# Patient Record
Sex: Male | Born: 1945 | Race: White | Hispanic: No | Marital: Married | Smoking: Former smoker
Health system: Southern US, Community
[De-identification: ages and names within clinical notes are randomized; demographics above are authoritative.]

## PROBLEM LIST (undated history)

## (undated) DIAGNOSIS — F418 Other specified anxiety disorders: Secondary | ICD-10-CM

## (undated) DIAGNOSIS — C099 Malignant neoplasm of tonsil, unspecified: Secondary | ICD-10-CM

## (undated) HISTORY — PX: LIPOMA EXCISION: SHX5283

## (undated) HISTORY — DX: Other specified anxiety disorders: F41.8

---

## 2005-09-14 ENCOUNTER — Emergency Department (HOSPITAL_COMMUNITY): Admission: EM | Admit: 2005-09-14 | Discharge: 2005-09-14 | Payer: Self-pay | Admitting: Emergency Medicine

## 2015-02-17 DIAGNOSIS — C099 Malignant neoplasm of tonsil, unspecified: Secondary | ICD-10-CM

## 2015-02-17 HISTORY — DX: Malignant neoplasm of tonsil, unspecified: C09.9

## 2015-02-17 HISTORY — PX: OTHER SURGICAL HISTORY: SHX169

## 2015-03-17 NOTE — Progress Notes (Signed)
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 [x]  []  15lb weight loss last 3 months  Swallowing concerns? []  [x]    PEG? []  [x]     Referrals Yes No Comments  Social Work? []  [x]    Dentistry? []  []  Edentulous maxilla, 6 anterior mandibular teeth  Swallowing therapy? []  [x]    Nutrition? []  [x]    Med/Onc? []  [x]     Safety Issues Yes No Comments  Prior radiation? []  [x]    Pacemaker/ICD? []  [x]    Possible current pregnancy? []  [x]    Is the patient on methotrexate? []  [x]     Tobacco/Marijuana/Snuff/ETOH use: was a 1 pack a day smoker but has stopped smoking for the last 12 years  Past/Anticipated interventions by otolaryngology, if any:   Past/Anticipated interventions by medical oncology, if any:  Current Complaints / other details:

## 2015-03-19 ENCOUNTER — Telehealth: Payer: Self-pay | Admitting: Hematology and Oncology

## 2015-03-19 ENCOUNTER — Ambulatory Visit
Admission: RE | Admit: 2015-03-19 | Discharge: 2015-03-19 | Disposition: A | Discharge status: Home / Self Care | Payer: Medicare Other | Source: Ambulatory Visit | Attending: Radiation Oncology | Admitting: Radiation Oncology

## 2015-03-19 ENCOUNTER — Encounter: Payer: Self-pay | Admitting: Radiation Oncology

## 2015-03-19 ENCOUNTER — Telehealth: Payer: Self-pay | Admitting: *Deleted

## 2015-03-19 VITALS — BP 127/76 | HR 68 | Temp 98.4°F | Resp 12 | Wt 141.2 lb

## 2015-03-19 DIAGNOSIS — Z87891 Personal history of nicotine dependence: Secondary | ICD-10-CM | POA: Diagnosis not present

## 2015-03-19 DIAGNOSIS — Z510 Encounter for antineoplastic radiation therapy: Secondary | ICD-10-CM | POA: Insufficient documentation

## 2015-03-19 DIAGNOSIS — C099 Malignant neoplasm of tonsil, unspecified: Secondary | ICD-10-CM | POA: Insufficient documentation

## 2015-03-19 DIAGNOSIS — R634 Abnormal weight loss: Secondary | ICD-10-CM | POA: Insufficient documentation

## 2015-03-19 DIAGNOSIS — R590 Localized enlarged lymph nodes: Secondary | ICD-10-CM | POA: Insufficient documentation

## 2015-03-19 NOTE — Telephone Encounter (Signed)
  Oncology Nurse Navigator Documentation Referral date to RadOnc/MedOnc: 03/11/15 (03/19/15 1638) Navigator Encounter Type: Introductory phone call (03/19/15 1638)      Placed introductory call to new referral patient.  LVM introduced myself as the oncology nurse navigator that works with Dr. Isidore Moos with whom he met earlier today.  Indicated I would be joining him tomorrow during his appt with Dr. Alvy Bimler.  Gayleen Orem, RN, BSN, [place] at [place] [phone]

## 2015-03-19 NOTE — Progress Notes (Signed)
Radiation Oncology         [phone] ________________________________  Initial Outpatient Consultation  Name: Paxon Propes MRN: [id number]  Date: 03/19/2015  DOB: 1945/12/15  CC:No primary care provider on file.  Melida Quitter, MD   REFERRING PHYSICIAN: Melida Quitter, MD  DIAGNOSIS:    ICD-9-CM ICD-10-CM   1. Malignant neoplasm of tonsil 146.0 C09.9 Ambulatory referral to Social Work  2. 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 male who presented with a sore throat and ulcer in the region of his right tonsil and soft palate months ago. He initially irrigated this at home but did not seek medical attention. The patient eventually saw Dr. Redmond Baseman who also palpated a 1.5cm retro-mandibular right lymph node. A right tonsil mass was fairly infiltratrative, judged to be T3 stage. Biopsy on 02-17-15 of the oropharynx relevealed pathology as described above in the diagnosis. The patient has not yet had any diagnostic imaging. "In March I thought it was like strep. I just ignored it for about a week, at that point I thought I just had an ulcer. This went on for about a month and a half. When I irrigated, water would come out of the roof of my mouth. Every now and then I get twinges in my (right) ear," the patient stated. The patient's lymph node (upper right neck) is hard and jaw is stiff. The patient was accompanied by his male partner for today's radiation oncology appointment. They are dedicated  to an "organic diet", which may have contributed to his recent weight loss of 10-15 pounds. 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 The patient also stated that he needs to visit [place] for work from September to October of 2016, but is flexible due to his treatment plan.      Nutrition Status Yes No Comments  Weight changes? [x]  []  15lb weight loss last 3 months  Swallowing concerns? []  [x]    PEG? []  [x]     Referrals Yes No Comments  Social Work? []  [x]    Dentistry? []  []  Edentulous maxilla, 6 anterior mandibular teeth  Swallowing therapy? []  [x]    Nutrition? []  [x]    Med/Onc? []  [x]     Safety Issues Yes No Comments  Prior radiation? []  [x]    Pacemaker/ICD? []  [x]    Possible current pregnancy? []  [x]    Is the patient on methotrexate? []  [x]     Tobacco/Marijuana/Snuff/ETOH use: was a 1 pack a day smoker but has stopped smoking for the last 12 years. It was stated during the 03/19/2015 radiation oncology appointment that the patient smoked until 2009 and that he would smoke organic cigarettes socially until quitting 3-4 years ago. He denies heavy drinking or using chewing tobacco. He confirms smoking mariajuana socially the past 3-4 years.  PREVIOUS  RADIATION THERAPY: No  PAST MEDICAL HISTORY:  has no past medical history on file.    PAST SURGICAL HISTORY:History reviewed. No pertinent past surgical history.  FAMILY HISTORY: family history includes Other in his brother.  SOCIAL HISTORY:  reports that he quit smoking about 12 years ago. He has never used smokeless tobacco. He reports that he uses illicit drugs (Marijuana). He reports that he does not drink alcohol.  ALLERGIES: Review of patient's allergies indicates not on file.  MEDICATIONS:  No current outpatient prescriptions on file.   No current facility-administered medications for this encounter.     REVIEW OF SYSTEMS:  Notable for that above.   PHYSICAL EXAM:  weight is 141 lb 3.2 oz (64.048 kg). His oral temperature is 98.4 F (36.9 C). His blood pressure is 127/76 and his pulse is 68. His respiration is 12 and oxygen saturation is 100%.   General: Alert and oriented, in no acute distress HEENT: Head is normocephalic. Extraocular movements are intact. Oropharynx is notable for tongue deviating slightly to the right. 3cm mass observed via the R tonsil area, infiltrative into soft palate and posterior base of tongue, with erosive component. I suspect the tumor infiltrates > 4cm of tissue (T3). Upper Dentures were removed.  Trismus. Neck: Neck is notable for 1.5cm palpable lymph node that is firm at the angle of the right mandible. Heart: Regular in rate and rhythm with no murmurs, rubs, or gallops. Chest: Clear to auscultation bilaterally, with no rhonchi, wheezes, or rales. Abdomen: Soft, nontender, nondistended, with no rigidity or guarding. Over the abdomen there are palpable masses consistent with lipomas. The patient reported that this runs in the family and has been present most of his life. Extremities: No cyanosis or edema. Over the forearms there are palpable masses consistent with lipomas. The patient reported that this runs in the family and has been present most of his life. Lymphatics: see Neck Exam Skin: No concerning lesions. Musculoskeletal: symmetric strength and muscle tone throughout.  Neurologic: Cranial nerves II through XII are grossly intact. No obvious focalities. Speech is fluent. Coordination is intact. Psychiatric: Judgment and insight are intact. Affect is appropriate.   ECOG = 1  0 - Asymptomatic (Fully active, able to carry on all predisease activities without restriction)  1 - Symptomatic but completely ambulatory (Restricted in physically strenuous activity but ambulatory and able to carry out work of a light or sedentary nature. For example, light  housework, office work)  2 - Symptomatic, <50% in bed during the day (Ambulatory and capable of all self care but unable to carry out any work activities. Up and about more than 50% of waking hours)  3 - Symptomatic, >50% in bed, but not bedbound (Capable of only limited self-care, confined to bed or chair 50% or more of waking hours)  4 - Bedbound (Completely disabled. Cannot carry on any self-care. Totally confined to bed or chair)  5 - Death   Eustace Pen MM, Creech RH, Tormey DC, et al. [phone]). "Toxicity and response criteria of the [facility] Group". [place] Oncol. 5 (6): 649-55   LABORATORY DATA:  No results found for: WBC, HGB, HCT, MCV, PLT CMP  No results found for: NA, K, CL, CO2, GLUCOSE, BUN, CREATININE, CALCIUM, PROT, ALBUMIN, AST, ALT, ALKPHOS, BILITOT, GFRNONAA, GFRAA       RADIOGRAPHY: No results found.    IMPRESSION/PLAN:  This is a delightful patient with head and neck cancer. I do recommend radiotherapy for this patient.  We discussed the potential risks, benefits, and side effects of radiotherapy. We talked in detail about acute and late effects. We discussed that some of the most bothersome acute effects may be mucositis, dysgeusia, salivary changes, skin irritation, hair loss, dehydration, weight loss and fatigue. We talked about late effects which include but are not necessarily limited to dysphagia, hypothyroidism, nerve injury, spinal cord injury, xerostomia, trismus, and neck edema. No guarantees of treatment were given. A consent form was signed and placed in the patient's medical record. The patient is enthusiastic about proceeding with treatment. I look forward to participating in the patient's care.   The patient and his partner vocalized their dedication to their natural, organic lifestyle and wishing to revise their power of attorney. The resource of a social worker will be provided. The patient and his male partner understand the purpose  and necessity of meeting with a nutritionist, dentist, swallowing therapist, and medical oncologist following today's radiation oncology appointment. A PET scan,  and simulation treatment planning session is to be scheduled with radiation treatments to follow. The traditional process in which patients undergo when expierencing radiation therapy was reviewed and discussed. The patient vocalized hesitancy from partaking in chemotherapy, moving forward. All questions and concerns vocalized were fully addressed. All consent paperwork were reviewed and signed.  PET, if showing metastatic disease, could change plans.  Simulation (treatment planning) will take place once released by dentistry.  We also discussed that the treatment of head and neck cancer is a multidisciplinary process to maximize treatment outcomes and quality of life. For this reasons the following referrals have been or will be made:  Medical oncology to discuss chemotherapy   Dentistry for dental evaluation, possible extractions in the radiation fields, and /or advice on reducing risk of cavities, osteoradionecrosis, or other oral issues.  Nutritionist for nutrition support during and after treatment.  Speech language pathology for swallowing and/or speech therapy.  Social work for social support.   Baseline labs including TSH.  The patient has been advised that if he develops any further questions or concerns in regards to his treatment, that he is encouraged to contact Dr. Isidore Moos, MD.  This document serves as a record of services personally performed by Eppie Gibson, MD. It was created on her behalf by Lenn Cal, a trained medical scribe. The creation of this record is based on the scribe's personal observations and the provider's statements to them. This document has been checked and approved by the attending provider. __________________________________________   Eppie Gibson, MD

## 2015-03-19 NOTE — Telephone Encounter (Signed)
new patient appt-s/w patient dtr and gave np appt for 08/11 @ 10:45 w/Dr. Alvy Bimler

## 2015-03-20 ENCOUNTER — Encounter: Payer: Self-pay | Admitting: *Deleted

## 2015-03-20 ENCOUNTER — Encounter: Payer: Self-pay | Admitting: Hematology and Oncology

## 2015-03-20 ENCOUNTER — Ambulatory Visit: Payer: Medicare Other

## 2015-03-20 ENCOUNTER — Telehealth: Payer: Self-pay | Admitting: Hematology and Oncology

## 2015-03-20 ENCOUNTER — Ambulatory Visit (HOSPITAL_BASED_OUTPATIENT_CLINIC_OR_DEPARTMENT_OTHER): Payer: Medicare Other | Admitting: Hematology and Oncology

## 2015-03-20 VITALS — BP 135/82 | HR 78 | Temp 98.4°F | Resp 18 | Ht 71.0 in | Wt 140.1 lb

## 2015-03-20 DIAGNOSIS — E46 Unspecified protein-calorie malnutrition: Secondary | ICD-10-CM

## 2015-03-20 DIAGNOSIS — C099 Malignant neoplasm of tonsil, unspecified: Secondary | ICD-10-CM | POA: Diagnosis present

## 2015-03-20 NOTE — Assessment & Plan Note (Signed)
We discussed multidisciplinary team approach to his recent diagnosis of locally advanced tonsillar cancer. PET CT scan is pending. The patient desire to go for an annual trip to [place] that is usually scheduled at the end of September. I recommended against delaying treatment unnecessarily due to his recent weight loss and hemoptysis. Assuming that the PET CT scan show localized disease in the head and neck region, I will recommend addition of chemotherapy to his radiation treatment. I recommend him to attend chemotherapy education class. The patient would be a good candidate for concurrent treatment with high-dose cisplatin. The patient is aware of the utility of placement of port and feeding tube prior to the start of treatment. I will keep an eye on test results next week and will call him for further confirmation about the plan for concurrent chemotherapy. I will arrange for return follow-up visit pending and decision next week. I addressed all questions and concerns.

## 2015-03-20 NOTE — Progress Notes (Signed)
[facility] NOTE  Patient Care Team: Leota Sauers, RN as Oncology Nurse Navigator Eppie Gibson, MD as Attending Physician (Radiation Oncology) Heath Lark, MD as Consulting Physician (Hematology and Oncology)  CHIEF COMPLAINTS/PURPOSE OF CONSULTATION:  Oropharyngeal squamous cell carcinoma  HISTORY OF PRESENTING ILLNESS:  Samuel Rhodes 69 y.o. male is here because of newly diagnosed oropharyngeal squamous cell carcinoma. According to the patient, the first initial presentation was due to sensation of sore throat since March 2016. The patient thought he saw white patches in the tonsillar region and try some salt water gargle. His daughter who is a Marine scientist initially did a visual inspection and according to the family it was normal. Starting around May 2016, the tonsillar region became ulcerated. The patient started to have trismus, anorexia with 10 pound weight loss and recurrent hemoptysis. He also had sensation of a lump in the right side of the neck and also sensation of mild reduced hearing on the right site. he denies difficulties with chewing food, swallowing difficulties, painful swallowing or changes in the quality of his voice. He was referred to ENT and I summarized the rest of the oncologic history is as follows:   Squamous cell carcinoma of tonsil   02/17/2015 Procedure Laryngoscopy confirmed oropharyngea/tonsillar mass invading to involved posterior tongue base and soft palate   02/17/2015 Pathology Results Right oropharyngeal biopsy showed squamous cell cancer, P16 positive   MEDICAL HISTORY:  History reviewed. No pertinent past medical history.  SURGICAL HISTORY: Past Surgical History  Procedure Laterality Date  . Lipoma excision      SOCIAL HISTORY: Social History   Social History  . Marital Status: Married    Spouse Name: N/A  . Number of Children: N/A  . Years of Education: N/A   Occupational History  . Not on file.   Social History  Main Topics  . Smoking status: Former Smoker    Quit date: 08/09/2002  . Smokeless tobacco: Never Used  . Alcohol Use: No  . Drug Use: Yes    Special: Marijuana  . Sexual Activity: Yes   Other Topics Concern  . Not on file   Social History Narrative    FAMILY HISTORY: Family History  Problem Relation Age of Onset  . Other Brother     severe reaction to contrast dye  . Cancer Paternal Grandfather     stomach ca    ALLERGIES:  has No Known Allergies.  MEDICATIONS:  Current Outpatient Prescriptions  Medication Sig Dispense Refill  . CO ENZYME Q-10 PO Take by mouth.    . DIGESTIVE ENZYMES PO Take by mouth.    . Ginkgo Biloba (GINKOBA PO) Take by mouth.    . OIL OF OREGANO PO Take by mouth.    . Probiotic Product (PROBIOTIC DAILY PO) Take by mouth.    Marland Kitchen UNABLE TO FIND Med Name:   Hart Robinsons     No current facility-administered medications for this visit.    REVIEW OF SYSTEMS:   Constitutional: Denies fevers, chills or abnormal night sweats Eyes: Denies blurriness of vision, double vision or watery eyes Respiratory: Denies cough, dyspnea or wheezes Cardiovascular: Denies palpitation, chest discomfort or lower extremity swelling Gastrointestinal:  Denies nausea, heartburn or change in bowel habits Skin: Denies abnormal skin rashes Neurological:Denies numbness, tingling or new weaknesses Behavioral/Psych: Mood is stable, no new changes  All other systems were reviewed with the patient and are negative.  PHYSICAL EXAMINATION: ECOG PERFORMANCE STATUS: 1 - Symptomatic but completely ambulatory  Filed Vitals:   03/20/15 1110  BP: 135/82  Pulse: 78  Temp: 98.4 F (36.9 C)  Resp: 18   Filed Weights   03/20/15 1110  Weight: 140 lb 1.6 oz (63.549 kg)    GENERAL:alert, no distress and comfortable. He looks thin and mildly cachectic SKIN: skin color, texture, turgor are normal, no rashes or significant lesions EYES: normal, conjunctiva are pink and non-injected,  sclera clear OROPHARYNX: He has a large ulcerated oropharyngeal mass extending to the soft palate NECK: The rest of the neck appear swollen. LYMPH:  There is palpable lymphadenopathy on the right side of the neck.  LUNGS: clear to auscultation and percussion with normal breathing effort HEART: regular rate & rhythm and no murmurs and no lower extremity edema ABDOMEN:abdomen soft, non-tender and normal bowel sounds Musculoskeletal:no cyanosis of digits and no clubbing  PSYCH: alert & oriented x 3 with fluent speech NEURO: no focal motor/sensory deficits ASSESSMENT:  Newly diagnosed squamous cell carcinoma of the Head & Neck, HPV Positive, Negative  PLAN:  Squamous cell carcinoma of tonsil We discussed multidisciplinary team approach to his recent diagnosis of locally advanced tonsillar cancer. PET CT scan is pending. The patient desire to go for an annual trip to [place] that is usually scheduled at the end of September. I recommended against delaying treatment unnecessarily due to his recent weight loss and hemoptysis. Assuming that the PET CT scan show localized disease in the head and neck region, I will recommend addition of chemotherapy to his radiation treatment. I recommend him to attend chemotherapy education class. The patient would be a good candidate for concurrent treatment with high-dose cisplatin. The patient is aware of the utility of placement of port and feeding tube prior to the start of treatment. I will keep an eye on test results next week and will call him for further confirmation about the plan for concurrent chemotherapy. I will arrange for return follow-up visit pending and decision next week. I addressed all questions and concerns.  Protein calorie malnutrition He has significant weight loss due to cancer and or axilla. We will schedule consultation to see a nutritionist prior to start of treatment.    All questions were answered. The patient knows to call the  clinic with any problems, questions or concerns. I spent 55 minutes counseling the patient face to face. The total time spent in the appointment was 60 minutes and more than 50% was on counseling.     [facility], [place], MD 03/20/2015 3:28 PM

## 2015-03-20 NOTE — Assessment & Plan Note (Signed)
He has significant weight loss due to cancer and or axilla. We will schedule consultation to see a nutritionist prior to start of treatment.

## 2015-03-20 NOTE — Telephone Encounter (Signed)
per pof to sch pt appt-gave pt copy of avs °

## 2015-03-20 NOTE — Progress Notes (Signed)
Checked in new patient. Gave pt my card for any questions or concerns regarding financial assistance one tx has been established. Also given Meredith's name if  Radiation is needed.

## 2015-03-20 NOTE — Progress Notes (Signed)
  Oncology Nurse Navigator Documentation   Navigator Encounter Type: Initial MedOnc (03/20/15 1105)    Barriers/Navigation Needs: No barriers at this time;Education (03/20/15 1105)      Met with patient during initial consult with Dr. Alvy Bimler.  He was accompanied by his girlfriend Freda Munro. 1. Further introduced myself as his/their Navigator, explained my role as a member of the Care Team. 2. Provided New Patient Information packet:  Contact information for physician, this navigator, other members of the Care Team  Advance Directive information ([place] blue pamphlet with LCSW insert)  Fall Prevention Patient Safety Plan  Appointment Guideline  ACS Referral Form  [facility] campus map with highlight of [place] 3. Assisted with post-consult appt scheduling. 4. Showed them the location of Dr. Ritta Slot office and [facility] as reference for future appts, including arrival procedure for these a 5. Provided introductory explanation of radiation treatment including SIM planning and fitting of head mask, showed them example of mask.   6. Provided photos/diagrams of feeding tube and PAC, explained their purpose. 7. Provided a tour of SIM and [place] areas, explained treatment and arrival procedures.  8. They verbalized understanding of information provided. I encouraged them to call with questions/concerns, they verbalized understanding.      Gayleen Orem, RN, BSN, [place] at [place] [phone]

## 2015-03-21 ENCOUNTER — Telehealth: Payer: Self-pay | Admitting: *Deleted

## 2015-03-21 NOTE — Telephone Encounter (Signed)
CALLED PATIENT TO INFORM OF APPTS. ON 03-25-15, 03-27-15 AND 03-31-15, SPOKE WITH PATIENT AND HE IS AWARE OF THESE APPTS.

## 2015-03-24 ENCOUNTER — Telehealth: Payer: Self-pay | Admitting: Hematology and Oncology

## 2015-03-24 ENCOUNTER — Telehealth: Payer: Self-pay | Admitting: *Deleted

## 2015-03-24 NOTE — Telephone Encounter (Signed)
Per staff message from [place] (navigatior) rescheduled ched class that was scheduled for 8/22 to 8/29. Left message for patient and mailed new schedule. Patient has existing appointment on 8/29 and 10 am is the only available ched class time.

## 2015-03-24 NOTE — Telephone Encounter (Signed)
  Oncology Nurse Navigator Documentation   Navigator Encounter Type: Telephone (03/24/15 1138)        Patient's SO Freda Munro called to cancel 8/22 appts b/c he will be out of town 8/22-24.  I arranged rescheduling of SLP with Garald Balding to 8/29; notified Audie Clear for rescheduling of chemo ed.  Gayleen Orem, RN, BSN, [place] at [place] [phone]

## 2015-03-24 NOTE — Telephone Encounter (Signed)
  Oncology Nurse Navigator Documentation   Navigator Encounter Type: Telephone (03/24/15 1235)      Called pat SO, informed of rescheduled Speech appt for 8/29 2:45, indicated she will be contacted for reschedulin of Chemo Ed.  She verbalized understanding.  Gayleen Orem, RN, BSN, [place] at [place] [phone]

## 2015-03-25 ENCOUNTER — Other Ambulatory Visit (HOSPITAL_COMMUNITY): Payer: Medicare Other | Admitting: Dentistry

## 2015-03-25 ENCOUNTER — Encounter (HOSPITAL_COMMUNITY): Payer: Self-pay | Admitting: Dentistry

## 2015-03-25 ENCOUNTER — Encounter: Payer: Medicare Other | Admitting: Nutrition

## 2015-03-25 ENCOUNTER — Ambulatory Visit (HOSPITAL_COMMUNITY): Payer: Self-pay | Admitting: Dentistry

## 2015-03-25 ENCOUNTER — Encounter: Payer: Self-pay | Admitting: Nutrition

## 2015-03-25 ENCOUNTER — Telehealth: Payer: Self-pay | Admitting: *Deleted

## 2015-03-25 VITALS — BP 122/76 | HR 57 | Temp 98.6°F

## 2015-03-25 DIAGNOSIS — M264 Malocclusion, unspecified: Secondary | ICD-10-CM

## 2015-03-25 DIAGNOSIS — K036 Deposits [accretions] on teeth: Secondary | ICD-10-CM

## 2015-03-25 DIAGNOSIS — C099 Malignant neoplasm of tonsil, unspecified: Secondary | ICD-10-CM

## 2015-03-25 DIAGNOSIS — K121 Other forms of stomatitis: Secondary | ICD-10-CM

## 2015-03-25 DIAGNOSIS — K08109 Complete loss of teeth, unspecified cause, unspecified class: Secondary | ICD-10-CM

## 2015-03-25 DIAGNOSIS — Z01818 Encounter for other preprocedural examination: Secondary | ICD-10-CM

## 2015-03-25 DIAGNOSIS — K0850 Unsatisfactory restoration of tooth, unspecified: Secondary | ICD-10-CM

## 2015-03-25 DIAGNOSIS — K030 Excessive attrition of teeth: Secondary | ICD-10-CM

## 2015-03-25 DIAGNOSIS — K0530 Chronic periodontitis, unspecified: Secondary | ICD-10-CM

## 2015-03-25 DIAGNOSIS — R252 Cramp and spasm: Secondary | ICD-10-CM

## 2015-03-25 MED ORDER — SODIUM FLUORIDE 1.1 % DT GEL: DENTAL | Status: AC

## 2015-03-25 NOTE — Progress Notes (Signed)
Patient did not show up for nutrition appointment. 

## 2015-03-25 NOTE — Telephone Encounter (Signed)
  Oncology Nurse Navigator Documentation   Navigator Encounter Type: Telephone (03/25/15 1638)     Called patient's SO, informed her that CT SIM is being scheduled for this Friday 11:00.  She verbalized understanding.   Gayleen Orem, RN, BSN, [place] at [place] [phone]

## 2015-03-25 NOTE — Patient Instructions (Addendum)
RADIATION THERAPY AND DECISIONS REGARDING YOUR TEETH  Xerostomia (dry mouth) Your salivary glands may be in the filed of radiation.  Radiation may include all or part of your saliva glands.  This will cause your saliva to dry up and you will have a dry mouth.  The dry mouth will be for the rest of your life unless your radiation oncologist tells you otherwise.  Your saliva has many functions:  Saliva wets your tongue for speaking.  It coats your teeth and the inside of your mouth for easier movement.  It helps with chewing and swallowing food.  It helps clean away harmful acid and toxic products made by the germs in your mouth, therefore it helps prevent cavities.  It kills some germs in your mouth and helps to prevent gum disease.  It helps to carry flavor to your taste buds.  Once you have lost your saliva you will be at higher risk for tooth decay and gum disease.  What can be done to help improve your mouth when there's not enough saliva:  1.  Your dentist may give a prescription for Salagen.  It will not bring back all of your saliva but may bring back some of it.  Also your saliva may be thick and ropy or white and foamy. It will not feel like it use to feel.  2.  You will need to swish with water every time your mouth feels dry.  YOU CANNOT suck on any cough drops, mints, lemon drops, candy, vitamin C or any other products.  You cannot use anything other than water to make your mouth feel less dry.  If you want to drink anything else you have to drink it all at once and brush afterwards.  Be sure to discuss the details of your diet habits with your dentist or hygienist.  Radiation caries: This is decay that happens very quickly once your mouth is very dry due to radiation therapy.  Normally cavities take six months to two years to become a problem.  When you have dry mouth cavities may take as little as eight weeks to cause you a problem.  This is why dental check ups every two  months are necessary as long as you have a dry mouth. Radiation caries typically, but not always, start at your gum line where it is hard to see the cavity.  It is therefore also hard to fill these cavities adequately.  This high rate of cavities happens because your mouth no longer has saliva and therefore the acid made by the germs starts the decay process.  Whenever you eat anything the germs in your mouth change the food into acid.  The acid then burns a small hole in your tooth.  This small hole is the beginning of a cavity.  If this is not treated then it will grow bigger and become a cavity.  The way to avoid this hole getting bigger is to use fluoride every evening as prescribed by your dentist.  You have to make sure that your teeth are very clean before you use the fluoride.  This fluoride in turn will strengthen your teeth and prepare them for another day of fighting acid.  If you develop radiation caries many times the damage is so large that you will have to have all your teeth removed.  This could be a big problem if some of these teeth are in the field of radiation.  Further details of why this could be   a big problem will follow.  (See Osteoradionecrosis).  Loss of taste (dysgeusia) This happens to varying degrees once you've had radiation therapy to your jaw region.  Many times taste is not completely lost but becomes limited.  The loss of taste is mostly due to radiation affecting your taste buds.  However if you have no saliva in your mouth to carry the flavor to your taste buds it would be difficult for your taste buds to taste anything.  That is why using water or a prescription for Salagen prior to meals and during meals may help with some of the taste.  Keep in mind that taste generally returns very slowly over the course of several months or several years after radiation therapy.  Don't give up hope.  Trismus According to your Radiation Oncologist your TMJ or jaw joints are going to be  partially or fully in the field of radiation.  This means that over time the muscles that help you open and close your mouth may get stiff.  This will potentially result in your not being able to open your mouth wide enough or as wide as you can open it now.  Le me give you an example of how slowly this happens and how unaware people are of it.  A gentlemen that had radiation therapy two years ago came back to me complaining that bananas are just too large for him to be able to fit them in between his teeth.  He was not able to open wide enough to bite into a banana.  This happens slowly and over a period of time.  What do we do to try and prevent this?  Your dentist will probably give you a stack of sticks called a trismus exercise device .  This stack will help your remind your muscles and your jaw joint to open up to the same distance every day.  Use these sticks every morning when you wake up according to the instructions given by the dentist.   You must use these sticks for at least one to two years after radiation therapy.  The reason for that is because it happens so slowly and keeps going on for about two years after radiation therapy.  Your hospital dentist will help you monitor your mouth opening and make sure that it's not getting smaller.  Osteoradionecrosis (ORN) This is a condition where your jaw bone after having had radiation therapy becomes very dry.  It has very little blood supply to keep it alive.  If you develop a cavity that turns into an abscess or an infection then the jaw bone does not have enough blood supply to help fight the infection.  At this point it is very likely that the infection could cause the death of your jaw bone.  When you have dead bone it has to be removed.  Therefore you might end up having to have surgery to remove part of your jaw bone, the part of the jaw bone that has been affected.   Healing is also a problem if you are to have surgery in the areas where the bone  has had radiation therapy.  The same reasons apply.  If you have surgery you need more blood supply which is not available.  When blood supply and oxygen are not available again, there is a chance for the bone to die.  Occasionally ORN happens on its own with no obvious reason.  This is quite rare.  We believe that   patients who continue to smoke and/or drink alcohol have a higher chance of having this bone problem.  Therefore once your jaw bone has had radiation therapy if there are any teeth in that area, you should never have them pulled.  You should also never have any surgery on your teeth or gums in that area unless the oral surgeon or Periodontist is aware of your history of radiation. There is some expensive management techniques that might be used to limit your risks.  The risks for ORN either from infection or spontaneous ( or on it's own) are life long.    TRISMUS  Trismus is a condition where the jaw does not allow the mouth to open as wide as it usually does.  This can happen almost suddenly, or in other cases the process is so slow, it is hard to notice it-until it is too far along.  When the jaw joints and/or muscles have been exposed to radiation treatments, the onset of Trismus is very slow.  This is because the muscles are losing their stretching ability over a long period of time, as long as 2 YEARS after the end of radiation.  It is therefore important to exercise these muscles and joints.  TRISMUS EXERCISES   Stack of tongue depressors measuring the same or a little less than the last documented MIO (Maximum Interincisal Opening).  Secure them with a rubber band on both ends.  Place the stack in the patient's mouth, supporting the other end.  Allow 30 seconds for muscle stretching.  Rest for a few seconds.  Repeat 3-5 times  For all radiation patients, this exercise is recommended in the mornings and evenings unless otherwise instructed.  The exercise should be done for  a period of 2 YEARS after the end of radiation.  MIO should be checked routinely on recall dental visits by the general dentist or the hospital dentist.  The patient is advised to report any changes, soreness, or difficulties encountered when doing the exercises.  The patient was instructed to remove denture at bedtime. Patient is to clean palatal area with a wash rag and water at bedtime. Patient is a brush upper complete denture with soap and water and soak in water at bedtime.  Dr. Enrique Sack

## 2015-03-25 NOTE — Progress Notes (Signed)
DENTAL CONSULTATION  Date of Consultation:  03/25/2015 Patient Name:   Samuel Rhodes Date of Birth:   02-Sep-1945 Medical Record Number: [id number]  VITALS: BP 122/76 mmHg  Pulse 57  Temp(Src) 98.6 F (37 C) (Oral)   CHIEF COMPLAINT: Patient referred by Dr. Isidore Moos for a dental consultation.  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 Patient with anticipated radiation therapy and possible chemotherapy. Patient is now seen as part of a medically necessary pre-chemoradiation therapy dental protocol examination.  The patient currently denies acute toothaches, swellings, or abscesses. Patient was last seen by a Dentist in [place] for fabrication of an upper complete denture. This was approximately 3-4 years ago by report.  Patient indicates that the upper denture "fits perfect". Patient does not have a  Mandibular partial denture. The patient does not have a primary dentist in [place]. Patient does have a history of trauma to his maxillary anterior teeth approximately 7 years ago after a fall in front of the [place] [facility] long emergency room area.  This subsequently led to removal of all remaining maxillary teeth and fabrication of the upper complete denture by patient report.  PROBLEM LIST: Patient Active Problem List   Diagnosis Date Noted  . Squamous cell carcinoma of tonsil 03/19/2015    Priority: High  . Protein calorie malnutrition 03/20/2015    PMH: History reviewed. No pertinent past medical history.  PSH: Past Surgical History  Procedure Laterality Date  . Lipoma excision      ALLERGIES: No Known Allergies  MEDICATIONS: Current Outpatient Prescriptions  Medication Sig Dispense Refill  . CO ENZYME Q-10 PO Take by mouth.    . DIGESTIVE ENZYMES PO Take by mouth.    . Ginkgo Biloba (GINKOBA PO) Take by mouth.    . OIL OF OREGANO PO Take by mouth.    . Probiotic Product (PROBIOTIC DAILY  PO) Take by mouth.    Marland Kitchen UNABLE TO FIND Med Name:   Hart Robinsons     No current facility-administered medications for this visit.    LABS: No results found for: WBC, HGB, HCT, MCV, PLT No results found for: NA, K, CL, CO2, GLUCOSE, BUN, CREATININE, CALCIUM, GFRNONAA, GFRAA No results found for: INR, PROTIME No results found for: PTT  SOCIAL HISTORY: Social History   Social History  . Marital Status: Married    Spouse Name: N/A  . Number of Children: N/A  . Years of Education: N/A   Occupational History  . Not on file.   Social History Main Topics  . Smoking status: Former Smoker    Quit date: 08/09/2002  . Smokeless tobacco: Never Used  . Alcohol Use: No  . Drug Use: Yes    Special: Marijuana  . Sexual Activity: Yes   Other Topics Concern  . Not on file   Social History Narrative    FAMILY HISTORY: Family History  Problem Relation Age of Onset  . Other Brother     severe reaction to contrast dye  . Cancer Paternal Grandfather     stomach ca    REVIEW OF SYSTEMS: Reviewed with the patient and is included in dental record.  DENTAL HISTORY: CHIEF COMPLAINT: Patient referred by Dr. Isidore Moos for a dental consultation.  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 Patient with anticipated radiation therapy and possible chemotherapy. Patient is now seen as part of a medically necessary pre-chemoradiation  therapy dental protocol examination.  The patient currently denies acute toothaches, swellings, or abscesses. Patient was last seen by a Dentist in [place] for fabrication of an upper complete denture. This was approximately 3-4 years ago by report.  Patient indicates that the upper denture "fits perfect". Patient does not have a  Mandibular partial denture. The patient does not have a primary dentist in [place], [place]. Patient does have a history of trauma to his maxillary anterior teeth  approximately 7 years ago after a fall in front of the [place] long emergency room area.  This subsequently led to removal of all remaining maxillary teeth and fabrication of the upper complete denture by patient report.  DENTAL EXAMINATION: GENERAL: The patient is a well-developed, slightly built male in no acute distress. HEAD AND NECK: The patient has right neck lymphadenopathy at the angle of the mandible. I do not palpate any left neck lymphadenopathy. Patient has trismus symptoms with maximum interincisal opening of approximately 25 mm (measured with upper complete denture in place). INTRAORAL EXAM: The patient has normal saliva. The right tonsil and soft palate are consistent with squamous cell carcinoma diagnosis. The patient has mid palatal denture hyperplasia. The patient has dark pigmented areas on the buccal of #19 and the lingual of #20 area. These most likely are amalgam tattoos but could ideally be biopsied to rule out melanoma. DENTITION: The patient is missing all maxillary teeth as well as tooth numbers 17, 18, 19, 20, 28, 30, 31, and 32. Patient has significant incisal attrition associated with tooth numbers 22, 23, 24, 25, 26, and 27. PERIODONTAL: The patient has chronic periodontitis with plaque accumulations, gingival recession, and no obvious tooth mobility. DENTAL CARIES/SUBOPTIMAL RESTORATIONS: The patient has a suboptimal dental restoration associated with tooth #29. Patient ideally could benefit from evaluation for multiple crown or bridge restorations after the radiation therapy is completed. ENDODONTIC: The patient currently denies acute pulpitis symptoms. Patient has had a previous root canal therapy associated with tooth #21 with no obvious persistent periapical pathology or symptoms. CROWN AND BRIDGE: There are no crown or bridge restorations that patient could benefit from future evaluation for multiple crown or bridge restorations. PROSTHODONTIC: The patient has an upper  complete denture. The upper complete denture is acceptable, but patient could benefit from evaluation for new upper complete and lower cast partial dentures.  Patient denies having a partial denture. OCCLUSION: The patient has a poor occlusal scheme secondary to multiple missing teeth, supra-eruption and drifting of the unopposed teeth into the edentulous areas, significant mandibular incisal attrition, and lack of replacement of all missing teeth with clinically acceptable dental prostheses.  RADIOGRAPHIC INTERPRETATION: An orthopantogram was taken and supplemented with four lower periapical radiographs. Patient has multiple missing teeth. There is incipient to moderate bone loss. There is evidence of mandibular anterior incisal attrition. Patient has a previous root canal therapy on tooth #21 with no obvious persistent periapical pathology or radiolucency. There are no obvious periapical radiolucencies.   ASSESSMENTS: 1. Squamous cell carcinoma of the right tonsil 2. Pre-chemoradiation therapy dental protocol 3. Mandibular anterior incisal attrition numbers 22 through 27. 4. Chronic periodontitis with bone loss 5. Plaque accumulations-minimal 6. Multiple missing teeth 7. Upper complete denture with no lower partial denture 8. Poor occlusal scheme and malocclusion 9. Suboptimal dental restoration on tooth #29. 10. Mid- palatal denture stomatitis 11. 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 12. Pigmented areas on the alveolar ridge on the buccal of #19 and lingual of #20  PLAN/RECOMMENDATIONS: 1. I discussed the risks, benefits, and complications of various treatment options with the patient in relationship to his medical and dental conditions, anticipated radiation therapy and possible chemotherapy, and risk for xerostomia, radiation caries, trismus, mucositis, taste changes, gum and jawbone changes, and risk for infection and osteoradionecrosis. We discussed various  treatment options to include no treatment, multiple extractions with alveoloplasty, pre-prosthetic surgery as indicated, biopsy of pigmented areas on the lower left mandible, periodontal therapy, dental restorations, root canal therapy, crown and bridge therapy, implant therapy, and replacement of missing teeth as indicated.  We also discussed fabrication of lower fluoride trays and scatter protection devices. The patient currently wishes to proceed with impression today for the fabrication of lower fluoride tray and scatter protection device. This will be inserted on Thursday at 11:30 AM prior to PET scan. The patient refuses all other dental treatment at this time but will consider future prosthodontic rehabilitation with a new upper complete denture andlower cast partial denture and possible crown or bridge restorations for the lower arch as indicated. The patient is currently cleared for radiation therapy at this time and Dr. Isidore Moos may proceed with simulation after the scatter protection device is inserted.   2. Discussion of findings with medical team and coordination of future medical and dental care as needed.patient may benefit from physical therapy consultation concerning his trismus symptoms.  I spent in excess of 120 minutes during the conduct of this consultation and >50% of this time involved direct face-to-face encounter for counseling and/or coordination of the patient's care.    Lenn Cal, DDS

## 2015-03-26 ENCOUNTER — Other Ambulatory Visit: Payer: Self-pay | Admitting: Hematology and Oncology

## 2015-03-26 ENCOUNTER — Telehealth: Payer: Self-pay | Admitting: *Deleted

## 2015-03-26 ENCOUNTER — Other Ambulatory Visit: Payer: Self-pay | Admitting: Radiation Oncology

## 2015-03-26 ENCOUNTER — Encounter: Payer: Self-pay | Admitting: *Deleted

## 2015-03-26 DIAGNOSIS — C099 Malignant neoplasm of tonsil, unspecified: Secondary | ICD-10-CM

## 2015-03-26 NOTE — Progress Notes (Signed)
[facility] Psychosocial Distress Screening Clinical Social Work  Clinical Social Work was referred by distress screening protocol and by radiation oncologist for psychosocial assessment/support.  The patient scored a 5 on the Psychosocial Distress Thermometer which indicates moderate distress. Clinical Social Worker attempted to contact patient by phone to assess for distress and other psychosocial needs.   [facility] DISTRESS SCREENING 03/19/2015  Screening Type Initial Screening  Distress experienced in past week (1-10) 5  Emotional problem type Adjusting to illness  Spiritual/Religous concerns type Facing my mortality  Information Concerns Type Lack of info about treatment  Physical Problem type Mouth sores/swallowing  Physician notified of physical symptoms Yes  Referral to clinical psychology No  Referral to clinical social work Yes   Clinical Social Worker follow up needed: Yes.    If yes, follow up plan:  CSW left voicemail requesting patient return call.  Polo Riley, MSW, LCSW, OSW-C Clinical Social Worker [facility] [phone]

## 2015-03-26 NOTE — Telephone Encounter (Signed)
CALLED PATIENT TO INFORM OF CT  FOR 03-27-15, SPOKE WITH PATIENT AND HE IS AWARE OF THIS TEST

## 2015-03-27 ENCOUNTER — Ambulatory Visit (HOSPITAL_COMMUNITY)
Admission: RE | Admit: 2015-03-27 | Discharge: 2015-03-27 | Disposition: A | Discharge status: Home / Self Care | Payer: Medicare Other | Source: Ambulatory Visit | Attending: Radiation Oncology | Admitting: Radiation Oncology

## 2015-03-27 ENCOUNTER — Ambulatory Visit (HOSPITAL_BASED_OUTPATIENT_CLINIC_OR_DEPARTMENT_OTHER)
Admission: RE | Admit: 2015-03-27 | Discharge: 2015-03-27 | Disposition: A | Discharge status: Home / Self Care | Payer: Medicare Other | Source: Ambulatory Visit | Attending: Radiation Oncology | Admitting: Radiation Oncology

## 2015-03-27 ENCOUNTER — Ambulatory Visit (HOSPITAL_COMMUNITY): Payer: Self-pay | Admitting: Dentistry

## 2015-03-27 ENCOUNTER — Encounter (HOSPITAL_COMMUNITY): Payer: Self-pay

## 2015-03-27 VITALS — BP 142/84 | HR 50 | Temp 98.6°F

## 2015-03-27 DIAGNOSIS — Z79899 Other long term (current) drug therapy: Secondary | ICD-10-CM | POA: Diagnosis not present

## 2015-03-27 DIAGNOSIS — C099 Malignant neoplasm of tonsil, unspecified: Secondary | ICD-10-CM

## 2015-03-27 DIAGNOSIS — Z01818 Encounter for other preprocedural examination: Secondary | ICD-10-CM

## 2015-03-27 DIAGNOSIS — J439 Emphysema, unspecified: Secondary | ICD-10-CM | POA: Insufficient documentation

## 2015-03-27 DIAGNOSIS — R634 Abnormal weight loss: Secondary | ICD-10-CM

## 2015-03-27 DIAGNOSIS — Z463 Encounter for fitting and adjustment of dental prosthetic device: Secondary | ICD-10-CM

## 2015-03-27 LAB — CBC WITH DIFFERENTIAL/PLATELET
BASO%: 0.7 % (ref 0.0–2.0)
BASOS ABS: 0.1 10*3/uL (ref 0.0–0.1)
EOS%: 0.6 % (ref 0.0–7.0)
Eosinophils Absolute: 0.1 10*3/uL (ref 0.0–0.5)
HEMATOCRIT: 46.4 % (ref 38.4–49.9)
HGB: 15.5 g/dL (ref 13.0–17.1)
LYMPH#: 1.3 10*3/uL (ref 0.9–3.3)
LYMPH%: 14.6 % (ref 14.0–49.0)
MCH: 32.2 pg (ref 27.2–33.4)
MCHC: 33.3 g/dL (ref 32.0–36.0)
MCV: 96.5 fL (ref 79.3–98.0)
MONO#: 0.7 10*3/uL (ref 0.1–0.9)
MONO%: 7.4 % (ref 0.0–14.0)
NEUT#: 6.8 10*3/uL — ABNORMAL HIGH (ref 1.5–6.5)
NEUT%: 76.7 % — AB (ref 39.0–75.0)
PLATELETS: 208 10*3/uL (ref 140–400)
RBC: 4.81 10*6/uL (ref 4.20–5.82)
RDW: 13.7 % (ref 11.0–14.6)
WBC: 8.8 10*3/uL (ref 4.0–10.3)

## 2015-03-27 LAB — COMPREHENSIVE METABOLIC PANEL (CC13)
ALBUMIN: 4.3 g/dL (ref 3.5–5.0)
ALK PHOS: 73 U/L (ref 40–150)
ALT: 18 U/L (ref 0–55)
AST: 20 U/L (ref 5–34)
Anion Gap: 13 mEq/L — ABNORMAL HIGH (ref 3–11)
BILIRUBIN TOTAL: 0.69 mg/dL (ref 0.20–1.20)
BUN: 21.7 mg/dL (ref 7.0–26.0)
CO2: 23 meq/L (ref 22–29)
Calcium: 9.7 mg/dL (ref 8.4–10.4)
Chloride: 102 mEq/L (ref 98–109)
Creatinine: 1 mg/dL (ref 0.7–1.3)
EGFR: 75 mL/min/{1.73_m2} — AB (ref >90)
GLUCOSE: 90 mg/dL (ref 70–140)
POTASSIUM: 4.6 meq/L (ref 3.5–5.1)
SODIUM: 138 meq/L (ref 136–145)
TOTAL PROTEIN: 7.6 g/dL (ref 6.4–8.3)

## 2015-03-27 LAB — GLUCOSE, CAPILLARY: Glucose-Capillary: 94 mg/dL (ref 65–99)

## 2015-03-27 LAB — TSH CHCC: TSH: 0.767 m(IU)/L (ref 0.320–4.118)

## 2015-03-27 MED ORDER — FLUDEOXYGLUCOSE F - 18 (FDG) INJECTION
7.4000 | Freq: Once | INTRAVENOUS | Status: DC | PRN
  Administered 2015-03-27: 7.4 via INTRAVENOUS
  Filled 2015-03-27: qty 7.4

## 2015-03-27 MED ORDER — IOHEXOL 300 MG/ML  SOLN
100.0000 mL | Freq: Once | INTRAMUSCULAR | Status: AC | PRN
  Administered 2015-03-27: 100 mL via INTRAVENOUS

## 2015-03-27 NOTE — Patient Instructions (Signed)
Patient:            Samuel Rhodes Date of Birth:  07-Dec-1945 MRN:                [id number]   FLUORIDE TRAYS PATIENT INSTRUCTIONS    Obtain prescription from the pharmacy.  Don't be surprised if it needs to be ordered.  Be sure to let the pharmacy know when you are close to needing a new refill for them to have it ready for you without interruption of Fluoride use.  The best time to use your Fluoride is before bed time.  You must brush your teeth very well and floss before using the Fluoride in order to get the best use out of the Fluoride treatments.  Place 1 drop of Fluoride gel per tooth in the tray.  Place the tray on your lower teeth and your upper teeth.  Make sure the trays are seated all the way.  Remember, they only fit one way on your teeth.  Insert for 5 full minutes.  At the end of the 5 minutes, take the trays out.  SPIT OUT excess. .  Do NOT rinse your mouth!  Do NOT eat or drink after treatments for at least 30 minutes.  This is why the best time for your treatments is before bedtime.  Clean the inside of your Fluoride trays using COLD WATER and a toothbrush.  In order to keep your Trays from discoloring and free from odors, soak them overnight in denture cleaners such as Efferdent.  Do not use bleach or non denture products.  Store the trays in a safe dry place AWAY from any heat until your next treatment.  If anything happens to your Fluoride trays, or they don't fit as well after any dental work, please let us know as soon as possible.  TRISMUS  Trismus is a condition where the jaw does not allow the mouth to open as wide as it usually does.  This can happen almost suddenly, or in other cases the process is so slow, it is hard to notice it-until it is too far along.  When the jaw joints and/or muscles have been exposed to radiation treatments, the onset of Trismus is very slow.  This is because the muscles are losing their stretching ability over a long period  of time, as long as 2 YEARS after the end of radiation.  It is therefore important to exercise these muscles and joints.  TRISMUS EXERCISES   Stack of tongue depressors measuring the same or a little less than the last documented MIO (Maximum Interincisal Opening).  Secure them with a rubber band on both ends.  Place the stack in the patient's mouth, supporting the other end.  Allow 30 seconds for muscle stretching.  Rest for a few seconds.  Repeat 3-5 times  For all radiation patients, this exercise is recommended in the mornings and evenings unless otherwise instructed.  The exercise should be done for a period of 2 YEARS after the end of radiation.  MIO should be checked routinely on recall dental visits by the general dentist or the hospital dentist.  The patient is advised to report any changes, soreness, or difficulties encountered when doing the exercises.

## 2015-03-27 NOTE — Progress Notes (Signed)
03/27/2015  Patient Name:   Samuel Rhodes Date of Birth:   1946-01-14 Medical Record Number: [id number]  BP 142/84 mmHg  Pulse 50  Temp(Src) 98.6 F (37 C) (Oral)  Leary Roca now presents for insertion of lower fluoride tray and scatter protection device.  PROCEDURE: Appliances were tried in and adjusted as needed. [place] Trismus device was previously fabricated at 25 mm using 16 sticks. Postop instructions were provided on a written and verbal format concerning the use and care of appliances. All questions were answered. Discussed pigmented areas involving the alveolar ridge in the area of #19 and 21. These most likely represented amalgam tattoos but need biopsy to prove this. Patient again refused biopsy at this time. Patient to return to clinic for periodic oral examination in approximately 2-3 weeks during radiation therapy. Patient to call if questions or problems arise before then.  Lenn Cal, DDS

## 2015-03-28 ENCOUNTER — Ambulatory Visit
Admission: RE | Admit: 2015-03-28 | Discharge: 2015-03-28 | Disposition: A | Discharge status: Home / Self Care | Payer: Medicare Other | Source: Ambulatory Visit | Attending: Radiation Oncology | Admitting: Radiation Oncology

## 2015-03-28 ENCOUNTER — Encounter: Payer: Self-pay | Admitting: Radiation Oncology

## 2015-03-28 ENCOUNTER — Encounter: Payer: Self-pay | Admitting: *Deleted

## 2015-03-28 DIAGNOSIS — C099 Malignant neoplasm of tonsil, unspecified: Secondary | ICD-10-CM

## 2015-03-28 HISTORY — DX: Malignant neoplasm of tonsil, unspecified: C09.9

## 2015-03-28 NOTE — Progress Notes (Signed)
Trimus of jaw.  Given device to assist with mobility of jaw.  Very nervous.  Use of herbal medication as indicated by his medication list.

## 2015-03-28 NOTE — Progress Notes (Signed)
Radiation Oncology         [phone] ________________________________  Name: Samuel Rhodes MRN: [id number]  Date: 03/28/2015  DOB: 1946/05/31  Follow-Up Visit Note  CC: No primary care provider on file.  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 On 03/27/15, the pt had a PET scan that showed the right-sided oropharyngeal primary, bilateral lymphadenopathy, but it also picked up bilateral lower lobe suspicious <1cm pulmonary nodules. The pt reports trimus of the jaw.He was given a device to assist with mobility of jaw.The pt also reports nausea that is not controlled by his Nexium anymore.  The pt also states that his right ear has stopped hurting. Of note, the pt is taking herbal remedies and Dr. Alvy Bimler is aware of them.  He feels the tonsil tumor is progressing.  ALLERGIES:  has No Known Allergies.  Meds: Current Outpatient Prescriptions  Medication Sig Dispense Refill  . CO ENZYME Q-10 PO Take by mouth.    . DIGESTIVE ENZYMES PO Take by mouth.    . esomeprazole (NEXIUM) 40 MG capsule Take 40 mg by mouth daily at 12 noon.    . Ginkgo Biloba (GINKOBA PO) Take by mouth.    . OIL OF OREGANO PO Take by mouth.    . Probiotic Product (PROBIOTIC DAILY PO) Take by mouth.    . sodium fluoride (FLUORISHIELD) 1.1 % GEL dental gel Instill one drop of gel per tooth space of fluoride tray. Place over lower teeth for 5 minutes. Remove. Spit out excess. Repeat nightly. 120 mL 11  . UNABLE TO FIND Med Name:   Hart Robinsons    . UNABLE TO FIND Med Name:      No current facility-administered medications for this encounter.   Facility-Administered Medications Ordered in Other Encounters  Medication Dose Route Frequency Provider Last Rate Last Dose  . fludeoxyglucose F - 18 (FDG) injection 7.4 milli Curie  7.4 milli Curie Intravenous Once PRN  Medication Radiologist, MD   7.4 milli Curie at 03/27/15 1335    Physical Findings: The patient is in no acute distress. Patient is alert and oriented. Wt Readings from Last 3 Encounters:  03/28/15 135 lb 6.4 oz (61.417 kg)  03/20/15 140 lb 1.6 oz (63.549 kg)  03/19/15 141 lb 3.2 oz (64.048 kg)    height is 5\' 11"  (1.803 m) and weight is 135 lb 6.4 oz (61.417 kg). His temperature is 98.3 F (36.8 C). His blood pressure is 128/75 and his pulse is 51. .  General: Alert and oriented, in no acute distress HEENT: Head is normocephalic. Extraocular movements are intact. Oropharynx is notable for infiltrative and erosive right tonsil tumor that is involving the soft palate and posterior tongue. Appears to be slightly more extensive than at consult Extremities: No cyanosis or edema. Psychiatric: Judgment and insight are intact. Affect is appropriate.   Lab Findings: Lab Results  Component Value Date   WBC 8.8 03/27/2015   HGB 15.5 03/27/2015   HCT 46.4 03/27/2015   MCV 96.5 03/27/2015   PLT 208 03/27/2015    Lab Results  Component Value Date   TSH 0.767 03/27/2015    Radiographic Findings: Ct Soft Tissue Neck W Contrast  03/27/2015   CLINICAL DATA:  69 year old male with right tonsillar squamous cell carcinoma. Subsequent encounter.  EXAM: CT NECK WITH CONTRAST  TECHNIQUE: Multidetector CT imaging of  the neck was performed using the standard protocol following the bolus administration of intravenous contrast.  CONTRAST:  167mL OMNIPAQUE IOHEXOL 300 MG/ML  SOLN  COMPARISON:  PET-CT from today reported separately.  FINDINGS: Pharynx and larynx: There is a hyper enhancing right pharyngeal mass with indistinct margins, and some central cavitation at the level of the soft palate. The lesion involves the anterior and lateral pharyngeal wall from the level of the soft palate to the glossal tonsillar sulcus (series 2, image 29). The mass encompasses roughly 33 x 28 x 37 mm (AP by transverse by  CC).  Just inferior to the mass there is a small 6 mm cyst associated with the lateral pharyngeal wall which presumably is benign (image 36).  The left pharynx, retropharyngeal space, and larynx are within normal limits.  Salivary glands: Mild mass effect on the right submandibular gland. The left submandibular gland, sublingual space, and both parotid glands appear within normal limits.  Thyroid: Negative.  Lymph nodes: Malignant nodal disease with some lesions demonstrating extracapsular extension at the right level 1B, level 2, and level 3 nodal stations. The largest nodal lesions are 2.7 cm long axis.  There are also 2 adjacent malignant nodes at the left level IIa station, collectively measuring up to 25 mm, individually 7-8 mm short axis.  The level 1 A nodes, level 4, level 5, and left level 3 nodes are within normal limits.  Vascular: Major vascular structures in the neck and at the skullbase are patent.  Limited intracranial: Negative.  Visualized orbits: Not included  Mastoids and visualized paranasal sinuses: Negative.  Skeleton: Degenerative changes in the cervical spine. No acute or suspicious osseous lesion identified.  Upper chest: Paraseptal emphysema. Perihilar bronchiectasis. Mild to moderate apical scarring greater on the right. No superior mediastinal lymphadenopathy.  IMPRESSION: 1. Right side oropharyngeal carcinoma with bilateral malignant lymphadenopathy, some extracapsular extension. Imaging stage is T2 N2c. See also PET-CT from today reported separately. 2. Emphysema.  Bronchiectasis.   Electronically Signed   By: Genevie Ann M.D.   On: 03/27/2015 15:50   Nm Pet Image Initial (pi) Skull Base To Thigh  03/27/2015   CLINICAL DATA:  Initial treatment strategy for right-sided tonsillar mass. Squamous cell carcinoma.  EXAM: NUCLEAR MEDICINE PET SKULL BASE TO THIGH  TECHNIQUE: 7.4 mCi F-18 FDG was injected intravenously. Full-ring PET imaging was performed from the skull base to thigh after the  radiotracer. CT data was obtained and used for attenuation correction and anatomic localization.  FASTING BLOOD GLUCOSE:  Value: 94 mg/dl  COMPARISON:  Today's neck CT, dictated separately.  FINDINGS: NECK  Right-sided oropharyngeal primary which measures 2.8 cm and a S.U.V. max of 13.6 on image 21.  Right-sided level 2 node measures 1.5 cm and a S.U.V. max of 7.2 on image 21.  A left level 2 node measures 8 mm and a S.U.V. max of 2.9 on image 21.  Partially necrotic right submandibular node which measures 1.3 cm and a S.U.V. max of 2.8 on image 29.  A right-sided level 3 node which measures 8 mm and a S.U.V. max of 4.6 on image 40.  CHEST  No thoracic nodal hypermetabolism. A left lower lobe pulmonary nodule measures 7 mm and a S.U.V. max of 4.8 on image 43.  A right lower lobe pulmonary nodule measures 7 mm and a S.U.V. max of 1.3 on image 49. Both nodules have adjacent smaller nodules.  ABDOMEN/PELVIS  No areas of abnormal hypermetabolism.  SKELETON  No abnormal marrow  activity.  CT IMAGES PERFORMED FOR ATTENUATION CORRECTION  Other neck findings deferred today's diagnostic CT. Normal heart size, without pericardial effusion. Moderate centrilobular and paraseptal emphysema. Reticular nodular opacities are also identified within the right middle lobe. Mild pectus excavatum deformity. Lower pole left renal lesion is likely a cyst. Abdominal aortic and branch vessel atherosclerosis.  IMPRESSION: 1. 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 2. Bilateral lower lobe hypermetabolic pulmonary nodules, left worse than right. These are suspicious for pulmonary metastasis. Synchronous primary bronchogenic carcinoma or carcinomas could look similar. Given surrounding adjacent smaller nodules, an infectious or inflammatory etiology (especially of the less hypermetabolic right lower lobe nodule) is a consideration. Consider sampling of the left lower lobe nodule. 3. Emphysema with multifocal  reticular nodular pulmonary opacities, likely due to infection or inflammation of indeterminate acuity.   Electronically Signed   By: Abigail Miyamoto M.D.   On: 03/27/2015 16:22    Impression/Plan:    1) Head and Neck Cancer Status: Pending treatment, highly suspicious but not definitive for metastatic disease  2) Nutritional Status: The pt has lost 6 lbs since 03/19/15. PEG tube: No  3) Risk Factors: The patient has been educated about risk factors including alcohol and tobacco abuse; he understands that avoidance of alcohol and tobacco is important to prevent recurrences as well as other cancers  4) Dental: The pt has seen Dr. Enrique Sack on 03/25/15 and had insertion of the lower fluoride tray and scatter protection device on 03/27/15. Encouraged to continue regular followup with dentistry, and dental hygiene including fluoride rinses.  5) Thyroid function:   Lab Results  Component Value Date   TSH 0.767 03/27/2015    6) Other: In depth discussion today, regarding his PET results, suspicious for lung metastases. Per IR, lesions too small to biopsy by CT guidance. CT surgical resection would have significant risks. I do not recommend biopsy.  I  went over options of treatment that include, but are not limited to, chemoradiation or induction chemotherapy and possibly radiation afterwards. I would like to give him the benefit of the doubt and not deny him potentially curative therapies.  I have reminded the pt of to let us know of all the herbal remedies he takes and to notify his other providers of them.   I am contacting med/onc to let them know that the pt is agreeable to induction chemotherapy. Dr. Alvy Bimler will arrange port-a-cath placement. We will not start radiotherapy until he is released by Dr. Alvy Bimler and restaging PET confirms lack of progressive metastases.  7) Follow-up with me once he is released by Dr. Alvy Bimler. The patient was encouraged to call with any issues or questions before  then.   This document serves as a record of services personally performed by Eppie Gibson, MD. It was created on her behalf by Darcus Austin, a trained medical scribe. The creation of this record is based on the scribe's personal observations and the provider's statements to them. This document has been checked and approved by the attending provider.     _____________________________________   Eppie Gibson, MD

## 2015-03-30 NOTE — Progress Notes (Signed)
  Oncology Nurse Navigator Documentation   Navigator Encounter Type: Clinic/MDC (03/28/15 1015)     To provide support and encouragement, care continuity and to assess for needs, met with patient during f/u appt with Dr. Isidore Moos.  He was accompanied by his SO, Freda Munro. He reported ongoing trismus, was encouraged to use tongue depressors. They verbalized understanding of Dr. Pearlie Oyster presentation of 03/27/15 PET results showing probable metastatic lung nodules. Dr. Isidore Moos presented treatment plan options.  He agreed to several months chemo followed by RT, with understanding re-staging PET to be conducted prior to start of RT. I provided post-appt emotional support, expressed support for his treatment decision.  They verbalized appreciation.   They understand I can be contacted with questions/concerns.  Gayleen Orem, RN, BSN, [place] at [place] [phone]

## 2015-03-31 ENCOUNTER — Other Ambulatory Visit: Payer: Self-pay | Admitting: *Deleted

## 2015-03-31 ENCOUNTER — Telehealth: Payer: Self-pay | Admitting: *Deleted

## 2015-03-31 ENCOUNTER — Other Ambulatory Visit: Payer: Self-pay | Admitting: Hematology and Oncology

## 2015-03-31 ENCOUNTER — Ambulatory Visit: Payer: Medicare Other

## 2015-03-31 ENCOUNTER — Other Ambulatory Visit: Payer: Medicare Other

## 2015-03-31 ENCOUNTER — Telehealth: Payer: Self-pay | Admitting: Hematology and Oncology

## 2015-03-31 DIAGNOSIS — C099 Malignant neoplasm of tonsil, unspecified: Secondary | ICD-10-CM

## 2015-03-31 NOTE — Telephone Encounter (Signed)
  Oncology Nurse Navigator Documentation   Navigator Encounter Type: Telephone (03/31/15 1445)     Spoke with patient's SO, confirmed PAC appt for 12:30 8/30, explained NPO after 0800, arrive at [facility].  She verbalized understanding. She asked about travelling to [place] during chemo.  I explained the regime, noted further conversation can be had with Dr. Alvy Bimler during next Tuesday's appt.  Gayleen Orem, RN, BSN, [place] at [place] [phone]

## 2015-03-31 NOTE — Telephone Encounter (Signed)
lvm for pt regarding to Aug and SEpt appt. pt will get sched in chemo class

## 2015-04-01 ENCOUNTER — Other Ambulatory Visit: Payer: Self-pay | Admitting: Hematology and Oncology

## 2015-04-01 ENCOUNTER — Telehealth: Payer: Self-pay | Admitting: *Deleted

## 2015-04-01 NOTE — Telephone Encounter (Signed)
Per staff message I have scheduled appts 

## 2015-04-07 ENCOUNTER — Other Ambulatory Visit (HOSPITAL_BASED_OUTPATIENT_CLINIC_OR_DEPARTMENT_OTHER): Payer: Medicare Other

## 2015-04-07 ENCOUNTER — Other Ambulatory Visit: Payer: Medicare Other

## 2015-04-07 ENCOUNTER — Ambulatory Visit: Payer: Medicare Other | Attending: Radiation Oncology

## 2015-04-07 ENCOUNTER — Ambulatory Visit: Payer: Medicare Other

## 2015-04-07 ENCOUNTER — Other Ambulatory Visit: Payer: Self-pay | Admitting: Hematology and Oncology

## 2015-04-07 ENCOUNTER — Ambulatory Visit (HOSPITAL_BASED_OUTPATIENT_CLINIC_OR_DEPARTMENT_OTHER): Payer: Medicare Other | Admitting: Hematology and Oncology

## 2015-04-07 VITALS — BP 125/74 | HR 56 | Temp 97.4°F | Resp 18 | Ht 71.0 in | Wt 133.1 lb

## 2015-04-07 DIAGNOSIS — R1310 Dysphagia, unspecified: Secondary | ICD-10-CM

## 2015-04-07 DIAGNOSIS — C099 Malignant neoplasm of tonsil, unspecified: Secondary | ICD-10-CM | POA: Diagnosis present

## 2015-04-07 DIAGNOSIS — R6884 Jaw pain: Secondary | ICD-10-CM

## 2015-04-07 LAB — COMPREHENSIVE METABOLIC PANEL (CC13)
ALT: 15 U/L (ref 0–55)
AST: 18 U/L (ref 5–34)
Albumin: 3.8 g/dL (ref 3.5–5.0)
Alkaline Phosphatase: 61 U/L (ref 40–150)
Anion Gap: 8 mEq/L (ref 3–11)
BUN: 24.1 mg/dL (ref 7.0–26.0)
CO2: 27 meq/L (ref 22–29)
Calcium: 9 mg/dL (ref 8.4–10.4)
Chloride: 102 mEq/L (ref 98–109)
Creatinine: 1.1 mg/dL (ref 0.7–1.3)
EGFR: 67 mL/min/{1.73_m2} — AB (ref >90)
GLUCOSE: 92 mg/dL (ref 70–140)
POTASSIUM: 4.6 meq/L (ref 3.5–5.1)
SODIUM: 137 meq/L (ref 136–145)
Total Bilirubin: 0.46 mg/dL (ref 0.20–1.20)
Total Protein: 6.8 g/dL (ref 6.4–8.3)

## 2015-04-07 LAB — CBC WITH DIFFERENTIAL/PLATELET
BASO%: 0.7 % (ref 0.0–2.0)
BASOS ABS: 0 10*3/uL (ref 0.0–0.1)
EOS%: 0.7 % (ref 0.0–7.0)
Eosinophils Absolute: 0 10*3/uL (ref 0.0–0.5)
HEMATOCRIT: 40.8 % (ref 38.4–49.9)
HGB: 13.9 g/dL (ref 13.0–17.1)
LYMPH#: 0.9 10*3/uL (ref 0.9–3.3)
LYMPH%: 13.6 % — ABNORMAL LOW (ref 14.0–49.0)
MCH: 32.7 pg (ref 27.2–33.4)
MCHC: 34.1 g/dL (ref 32.0–36.0)
MCV: 96 fL (ref 79.3–98.0)
MONO#: 0.4 10*3/uL (ref 0.1–0.9)
MONO%: 6.5 % (ref 0.0–14.0)
NEUT#: 5.3 10*3/uL (ref 1.5–6.5)
NEUT%: 78.5 % — AB (ref 39.0–75.0)
PLATELETS: 232 10*3/uL (ref 140–400)
RBC: 4.26 10*6/uL (ref 4.20–5.82)
RDW: 13.6 % (ref 11.0–14.6)
WBC: 6.8 10*3/uL (ref 4.0–10.3)

## 2015-04-07 LAB — MAGNESIUM (CC13): Magnesium: 2.2 mg/dl (ref 1.5–2.5)

## 2015-04-07 MED ORDER — MORPHINE SULFATE (CONCENTRATE) 20 MG/ML PO SOLN: 10.0000 mg | ORAL | Status: AC | PRN

## 2015-04-07 NOTE — Assessment & Plan Note (Signed)
I have prescribed Roxanol to take as needed before pain. I dscussed narcotic refill policy and I warned him about side effects of pain medicine.

## 2015-04-07 NOTE — Therapy (Signed)
[facility] [address] [place], [place], [address] Phone: [phone]   Fax:  [phone]  Patient Details  Name: Samuel Rhodes MRN: [id number] Date of Birth: 01/17/1946 Referring Provider:  Eppie Gibson, MD  Encounter Date: 04/07/2015  Pt and SLP briefly met and in light of pt's new plan (as of today) of treatment as primary chemo beginning May 15, 2015, and secondary rad tx to be discussed post-chemotherapy, SLP and pt agreed it best to reschedule ST appointment for swallowing prophylaxis and assessment in December 2016. Pt and SLP agreed upon a plan for SLP to evaluate pt's swallowing prior to any possible radiation therapy later this year.  Pt denied having any overt s/s aspiration currently. However, should difficulty arise a bedside swallow evaluation with this SLP could be scheduled, or a modified barium swallow evaluation may be warranted.   [facility] , [place], [place]  04/07/2015, 3:11 PM  [facility] [address] [place], [place], [address] Phone: [phone]   Fax:  [phone]

## 2015-04-07 NOTE — Progress Notes (Signed)
[facility] OFFICE PROGRESS NOTE  Patient Care Team: Leota Sauers, RN as Oncology Nurse Navigator Eppie Gibson, MD as Attending Physician (Radiation Oncology) Heath Lark, MD as Consulting Physician (Hematology and Oncology)  SUMMARY OF ONCOLOGIC HISTORY:   Squamous cell carcinoma of tonsil   02/17/2015 Procedure Laryngoscopy confirmed oropharyngea/tonsillar mass invading to involved posterior tongue base and soft palate   02/17/2015 Pathology Results Right oropharyngeal biopsy showed squamous cell cancer, P16 positive   03/27/2015 PET scan 1. 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  2. Bilateral lower lobe hypermetabolic pulmonary nodules, left worse than right. Suspicious for pulmonary metastasis.    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     INTERVAL HISTORY: Please see below for problem oriented charting. He returns today to discuss test results and to consent for chemotherapy. He denies hemoptysis. He continues to have discomfort in the right jaw/tonsillar region when he eats. He denies difficulty with swallowing.  REVIEW OF SYSTEMS:   Constitutional: Denies fevers, chills or abnormal weight loss Eyes: Denies blurriness of vision Respiratory: Denies cough, dyspnea or wheezes Cardiovascular: Denies palpitation, chest discomfort or lower extremity swelling Gastrointestinal:  Denies nausea, heartburn or change in bowel habits Skin: Denies abnormal skin rashes Lymphatics: Denies new lymphadenopathy or easy bruising Neurological:Denies numbness, tingling or new weaknesses Behavioral/Psych: Mood is stable, no new changes  All other systems were reviewed with the patient and are negative.  I have reviewed the past medical history, past surgical history, social history and family history with the patient and they are unchanged  from previous note.  ALLERGIES:  has No Known Allergies.  MEDICATIONS:  Current Outpatient Prescriptions  Medication Sig Dispense Refill  . CO ENZYME Q-10 PO Take 1 capsule by mouth daily.     Marland Kitchen esomeprazole (NEXIUM) 40 MG capsule Take 40 mg by mouth daily at 12 noon.    . Ginkgo Biloba (GINKOBA PO) Take 1 tablet by mouth daily.     . OIL OF OREGANO PO Take 1 tablet by mouth daily.     . Povidone-Iodine (IODINE SOLUTION EX) Take by mouth.    . Probiotic Product (PROBIOTIC DAILY PO) Take 1 tablet by mouth daily.     Marland Kitchen UNABLE TO FIND Take 2.5 mLs by mouth daily. CHAPARRAL LEAF    . morphine (ROXANOL) 20 MG/ML concentrated solution Take 0.5 mLs (10 mg total) by mouth every 4 (four) hours as needed for severe pain. 240 mL 0  . sodium fluoride (FLUORISHIELD) 1.1 % GEL dental gel Instill one drop of gel per tooth space of fluoride tray. Place over lower teeth for 5 minutes. Remove. Spit out excess. Repeat nightly. (Patient not taking: Reported on 04/07/2015) 120 mL 11   No current facility-administered medications for this visit.    PHYSICAL EXAMINATION: ECOG PERFORMANCE STATUS: 1 - Symptomatic but completely ambulatory  Filed Vitals:   04/07/15 1135  BP: 125/74  Pulse: 56  Temp: 97.4 F (36.3 C)  Resp: 18   Filed Weights   04/07/15 1135  Weight: 133 lb 1.6 oz (60.374 kg)    GENERAL:alert, no distress and comfortable SKIN: skin color, texture, turgor are normal, no rashes or significant lesions EYES: normal, Conjunctiva are pink and non-injected, sclera clear  Musculoskeletal:no cyanosis of digits and no clubbing  NEURO: alert & oriented x 3 with fluent speech, no focal motor/sensory deficits  LABORATORY DATA:  I have reviewed the data as  listed    Component Value Date/Time   NA 137 04/07/2015 0935   K 4.6 04/07/2015 0935   CO2 27 04/07/2015 0935   GLUCOSE 92 04/07/2015 0935   BUN 24.1 04/07/2015 0935   CREATININE 1.1 04/07/2015 0935   CALCIUM 9.0 04/07/2015 0935   PROT  6.8 04/07/2015 0935   ALBUMIN 3.8 04/07/2015 0935   AST 18 04/07/2015 0935   ALT 15 04/07/2015 0935   ALKPHOS 61 04/07/2015 0935   BILITOT 0.46 04/07/2015 0935    No results found for: SPEP, UPEP  Lab Results  Component Value Date   WBC 6.8 04/07/2015   NEUTROABS 5.3 04/07/2015   HGB 13.9 04/07/2015   HCT 40.8 04/07/2015   MCV 96.0 04/07/2015   PLT 232 04/07/2015      Chemistry      Component Value Date/Time   NA 137 04/07/2015 0935   K 4.6 04/07/2015 0935   CO2 27 04/07/2015 0935   BUN 24.1 04/07/2015 0935   CREATININE 1.1 04/07/2015 0935      Component Value Date/Time   CALCIUM 9.0 04/07/2015 0935   ALKPHOS 61 04/07/2015 0935   AST 18 04/07/2015 0935   ALT 15 04/07/2015 0935   BILITOT 0.46 04/07/2015 0935       RADIOGRAPHIC STUDIES: We reviewed the PET scan I have personally reviewed the radiological images as listed and agreed with the findings in the report.   ASSESSMENT & PLAN:  Squamous cell carcinoma of tonsil We have a long discussion today. I reviewed the imaging study with the patient again. I sense a reluctance on his part to pursue aggressive induction chemotherapy followed by radiation treatment. We discussed the goals of care. The patient really wants to have good quality of life and his mind is set to make this trip to [place] at the end of the month. He has not given up hope that he may still pursue aggressive treatment next month/in October. He is just not ready mentally for treatment now. I did warn him, if we delay treatment further, he could jeopardize the ability to achieve a remote chance of cure. He understood that risk. He requested me to consult with the radiation oncologist to see if palliative radiation only to "one spot" in the right tonsillar region might be able to buy him time until he returns from his trip from [place]. I have sent his request to the radiation oncologist for review. In the meantime, I will prescribe Roxanol for him  to take as needed before meals to alleviate some of the pain that he associated with eating. With his permission, I will cancel port placement and plan for chemotherapy this week. The patient will be returning home around October 6th and I have requested the Navigator to contact him for update then. He is willing to repeat PET CT scan in October for assessment and we can review plan of care again at that time. The patient is very grateful that he is able to pursue his plan for the trip to [place].  Jaw pain I have prescribed Roxanol to take as needed before pain. I dscussed narcotic refill policy and I warned him about side effects of pain medicine.   No orders of the defined types were placed in this encounter.   All questions were answered. The patient knows to call the clinic with any problems, questions or concerns. No barriers to learning was detected. I spent 25 minutes counseling the patient face to face. The total time  spent in the appointment was 40 minutes and more than 50% was on counseling and review of test results     [facility], Samuel Porco, MD 04/07/2015 12:50 PM

## 2015-04-07 NOTE — Assessment & Plan Note (Signed)
We have a long discussion today. I reviewed the imaging study with the patient again. I sense a reluctance on his part to pursue aggressive induction chemotherapy followed by radiation treatment. We discussed the goals of care. The patient really wants to have good quality of life and his mind is set to make this trip to [place] at the end of the month. He has not given up hope that he may still pursue aggressive treatment next month/in October. He is just not ready mentally for treatment now. I did warn him, if we delay treatment further, he could jeopardize the ability to achieve a remote chance of cure. He understood that risk. He requested me to consult with the radiation oncologist to see if palliative radiation only to "one spot" in the right tonsillar region might be able to buy him time until he returns from his trip from [place]. I have sent his request to the radiation oncologist for review. In the meantime, I will prescribe Roxanol for him to take as needed before meals to alleviate some of the pain that he associated with eating. With his permission, I will cancel port placement and plan for chemotherapy this week. The patient will be returning home around October 6th and I have requested the Navigator to contact him for update then. He is willing to repeat PET CT scan in October for assessment and we can review plan of care again at that time. The patient is very grateful that he is able to pursue his plan for the trip to [place].

## 2015-04-08 ENCOUNTER — Ambulatory Visit: Payer: Medicare Other

## 2015-04-08 ENCOUNTER — Inpatient Hospital Stay (HOSPITAL_COMMUNITY): Admission: RE | Admit: 2015-04-08 | Payer: Self-pay | Source: Ambulatory Visit

## 2015-04-08 ENCOUNTER — Ambulatory Visit (HOSPITAL_COMMUNITY): Admission: RE | Admit: 2015-04-08 | Payer: Medicare Other | Source: Ambulatory Visit

## 2015-04-09 ENCOUNTER — Ambulatory Visit: Payer: Medicare Other

## 2015-04-10 ENCOUNTER — Encounter: Payer: Self-pay | Admitting: Nutrition

## 2015-04-10 ENCOUNTER — Ambulatory Visit: Payer: Medicare Other

## 2015-04-10 ENCOUNTER — Ambulatory Visit: Payer: Self-pay

## 2015-04-11 ENCOUNTER — Ambulatory Visit: Payer: Medicare Other

## 2015-04-15 ENCOUNTER — Other Ambulatory Visit: Payer: Self-pay | Admitting: Hematology and Oncology

## 2015-04-15 ENCOUNTER — Ambulatory Visit: Payer: Medicare Other

## 2015-04-15 ENCOUNTER — Encounter: Payer: Self-pay | Admitting: Hematology and Oncology

## 2015-04-15 ENCOUNTER — Ambulatory Visit: Payer: Self-pay

## 2015-04-15 ENCOUNTER — Telehealth: Payer: Self-pay | Admitting: *Deleted

## 2015-04-15 DIAGNOSIS — C099 Malignant neoplasm of tonsil, unspecified: Secondary | ICD-10-CM

## 2015-04-15 DIAGNOSIS — R6884 Jaw pain: Secondary | ICD-10-CM

## 2015-04-15 DIAGNOSIS — F418 Other specified anxiety disorders: Secondary | ICD-10-CM

## 2015-04-15 HISTORY — DX: Other specified anxiety disorders: F41.8

## 2015-04-15 MED ORDER — CODEINE SULFATE 15 MG PO TABS: 15.0000 mg | ORAL_TABLET | Freq: Four times a day (QID) | ORAL | Status: AC | PRN

## 2015-04-15 MED ORDER — DIAZEPAM 5 MG PO TABS: 5.0000 mg | ORAL_TABLET | Freq: Two times a day (BID) | ORAL | Status: DC | PRN

## 2015-04-15 NOTE — Telephone Encounter (Signed)
  Oncology Nurse Navigator Documentation    Navigator Encounter Type: Telephone (04/15/15 1235)   Treatment Phase: Other (04/15/15 1235)     Interventions: Coordination of Care (04/15/15 1235)     Patient called with update and medication request. He stated he has been well, has been visiting family over past weeks. He noted he will be travelling extensively over the next weeks, would like to have some medications available as needed.  Valium - has taken in the past for anxiety with good response.  Codeine formulation - would like as an alternative to Roxanol which he taken only once since prescribed, something that can be "processed" more effectively. I noted I would inform Dr. Alvy Bimler, offered that she may want to see him before prescribing.  He verbalized understanding. He stated is here all this week, out of town 9/13-15, out of town 9/21 returning early October.  Gayleen Orem, RN, BSN, [place] at [place] [phone]

## 2015-04-15 NOTE — Telephone Encounter (Signed)
  Oncology Nurse Navigator Documentation   Navigator Encounter Type: Telephone (04/15/15 1334)     Called patient to inform the prescriptions for valium and codeine available for pick-up at Dr. Calton Dach RN station.  He verbalized understanding.  Gayleen Orem, RN, BSN, [place] at [place] [phone]                     Time Spent with Patient: 15 (04/15/15 1334)

## 2015-04-15 NOTE — Telephone Encounter (Signed)
I will issue a small supply of medication Ready for pick up

## 2015-04-16 ENCOUNTER — Ambulatory Visit: Payer: Self-pay

## 2015-04-16 ENCOUNTER — Ambulatory Visit: Payer: Medicare Other

## 2015-04-17 ENCOUNTER — Ambulatory Visit: Payer: Medicare Other

## 2015-04-17 ENCOUNTER — Ambulatory Visit: Payer: Self-pay

## 2015-04-18 ENCOUNTER — Ambulatory Visit: Payer: Medicare Other

## 2015-04-18 ENCOUNTER — Ambulatory Visit: Payer: Self-pay

## 2015-04-19 ENCOUNTER — Ambulatory Visit: Payer: Self-pay

## 2015-04-21 ENCOUNTER — Ambulatory Visit: Payer: Medicare Other

## 2015-04-22 ENCOUNTER — Ambulatory Visit: Payer: Medicare Other

## 2015-04-23 ENCOUNTER — Ambulatory Visit: Payer: Medicare Other

## 2015-04-24 ENCOUNTER — Ambulatory Visit: Payer: Medicare Other

## 2015-04-25 ENCOUNTER — Ambulatory Visit: Payer: Medicare Other

## 2015-04-28 ENCOUNTER — Ambulatory Visit: Payer: Medicare Other

## 2015-04-29 ENCOUNTER — Ambulatory Visit: Payer: Medicare Other

## 2015-04-30 ENCOUNTER — Ambulatory Visit: Payer: Medicare Other

## 2015-05-01 ENCOUNTER — Ambulatory Visit: Payer: Self-pay

## 2015-05-01 ENCOUNTER — Ambulatory Visit: Payer: Medicare Other

## 2015-05-02 ENCOUNTER — Ambulatory Visit: Payer: Medicare Other

## 2015-05-05 ENCOUNTER — Ambulatory Visit: Payer: Medicare Other

## 2015-05-06 ENCOUNTER — Ambulatory Visit: Payer: Medicare Other

## 2015-05-07 ENCOUNTER — Ambulatory Visit: Payer: Medicare Other

## 2015-05-08 ENCOUNTER — Ambulatory Visit: Payer: Medicare Other

## 2015-05-09 ENCOUNTER — Ambulatory Visit: Payer: Medicare Other

## 2015-05-12 ENCOUNTER — Ambulatory Visit: Payer: Medicare Other

## 2015-05-13 ENCOUNTER — Ambulatory Visit: Payer: Medicare Other

## 2015-05-14 ENCOUNTER — Ambulatory Visit: Payer: Medicare Other

## 2015-05-15 ENCOUNTER — Ambulatory Visit: Payer: Medicare Other

## 2015-05-16 ENCOUNTER — Ambulatory Visit: Payer: Medicare Other

## 2015-05-19 ENCOUNTER — Ambulatory Visit: Payer: Medicare Other

## 2015-05-20 ENCOUNTER — Ambulatory Visit: Payer: Medicare Other

## 2015-05-21 ENCOUNTER — Ambulatory Visit: Payer: Medicare Other

## 2015-05-21 ENCOUNTER — Telehealth: Payer: Self-pay | Admitting: *Deleted

## 2015-05-21 NOTE — Telephone Encounter (Signed)
  Oncology Nurse Navigator Documentation   Navigator Encounter Type: Telephone (05/21/15 1300) Patient Visit Type: Follow-up (05/21/15 1300)     Called patient to check on his wellbeing.  LVMM asking him to return my call.  Gayleen Orem, RN, BSN, [place] at [place] [phone]                   Time Spent with Patient: 15 (05/21/15 1300)

## 2015-05-22 ENCOUNTER — Ambulatory Visit: Payer: Medicare Other

## 2015-05-23 ENCOUNTER — Ambulatory Visit: Payer: Medicare Other

## 2015-05-26 ENCOUNTER — Ambulatory Visit: Payer: Medicare Other

## 2015-05-26 ENCOUNTER — Telehealth: Payer: Self-pay | Admitting: *Deleted

## 2015-05-26 NOTE — Telephone Encounter (Addendum)
A user error has taken place: encounter opened in error, closed for administrative reasons.

## 2015-05-26 NOTE — Telephone Encounter (Signed)
  Oncology Nurse Navigator Documentation   Navigator Encounter Type: Telephone (05/26/15 1452) Patient Visit Type: Follow-up (05/26/15 1452)     Patient returned my 10/12 VMM. He reported:  Mass on tonsil has visibly stopped growing he believes in response to his homeopathic tmts.  No active bleeding.  He is about to begin another homeopathic protocol this week, to last about 6 weeks.  Experiencing occasion referred ear pain but managed without narcotic medication.  Eating 4-5 small meals daily.  Maintaining weight at 125 lbs.  Energy level and bodily functions "good".  Has made several trips to [place] to see family and friends. He expressed appreciation for my follow-up; noted he would call if any acute changes.  Gayleen Orem, RN, BSN, [place] at [place] [phone]                    Time Spent with Patient: 15 (05/26/15 1452)

## 2015-05-27 ENCOUNTER — Ambulatory Visit: Payer: Medicare Other

## 2015-06-24 ENCOUNTER — Other Ambulatory Visit: Payer: Self-pay | Admitting: Hematology and Oncology

## 2015-06-30 ENCOUNTER — Telehealth: Payer: Self-pay | Admitting: *Deleted

## 2015-06-30 NOTE — Telephone Encounter (Signed)
  Oncology Nurse Navigator Documentation   Navigator Encounter Type: Telephone (06/30/15 1649) Patient Visit Type: Follow-up (06/30/15 1649)     Called Samuel Rhodes to check on his well being.  He noted:  No significant changes over the past month since my last call.  Tonsil mass continues to be unchanged.  Eating well, focusing on protein drinks other natural supplements.  Maintaining weight at 120 lbs.  Starting a new homeopathic protocol this week.  No limit to activities. He understands he can contact me if he has needs/concerns.  Gayleen Orem, RN, BSN, [place] at [place] [phone]                   Time Spent with Patient: 15 (06/30/15 1649)

## 2015-10-08 ENCOUNTER — Telehealth: Payer: Self-pay | Admitting: *Deleted

## 2015-10-08 NOTE — Telephone Encounter (Signed)
  Oncology Nurse Navigator Documentation  Navigator Location: [facility] (10/08/15 1109) Navigator Encounter Type: Telephone (10/08/15 1109) Telephone: Incoming Call;Medication Assistance (10/08/15 1109)             Barriers/Navigation Needs: Coordination of Care (10/08/15 1109)   Interventions: Coordination of Care (10/08/15 1109)     Received call from Samuel Rhodes.  She was calling to: Provide patient update:    Tumor growth has increased in area of right temporomandibular joint, making it more difficult for him to swallow.  Additional swellings evident in neck.  experiencing sharp sometimes radiating pain in right jaw.  Current weight is 113 lbs.  Experiencing increasing weakness, dizziness.  Drinking protein smoothies. Request medication refills for codeine, valium:  Taking codeine PRN, has about 10 tablets remaining.  Valium has been helpful, no tablets remaining.  Requesting increase from 5 mg to 10 mg. Request handicap placard:  They are travelling to [place] on 3/20 for a vendor show.  They need a placard to facilitate handicap parking.  I urged her to access [facility] for necessary form for processing by Dr. Alvy Bimler. I indicated it is likely Dr. Alvy Bimler will want to assess Joe before issuing Rx since his last visit with him 04/07/15.  Freda Munro noted that they are currently in [place], will be back in [place] this Sunday, would be available for follow-up appt any time during the weeks of 3/6 and 3/13. I indicated they will be contacted for an appt is needed.  This note forwarded to Dr. Alvy Bimler and RN Cameo.  Gayleen Orem, RN, BSN, [place] at [place] [phone]                        Time Spent with Patient: 15 (10/08/15 1109)

## 2015-10-08 NOTE — Telephone Encounter (Signed)
With his progressive decline, if he does not want treatment I recommend home hospice consult. Then he can have supportive treatment without further appointment Hospice can refill his medications. If he decline hospice, then I need to see him again before refill of medications. I have opening tomorrow at 930 am

## 2015-10-09 ENCOUNTER — Telehealth: Payer: Self-pay | Admitting: *Deleted

## 2015-10-09 ENCOUNTER — Other Ambulatory Visit: Payer: Self-pay | Admitting: *Deleted

## 2015-10-09 NOTE — Telephone Encounter (Signed)
  Oncology Nurse Navigator Documentation  Navigator Location: [facility] (10/09/15 1125) Navigator Encounter Type: Telephone (10/09/15 1125) Telephone: Incoming Call (10/09/15 1125)             Barriers/Navigation Needs: Coordination of Care (10/09/15 1125)   Interventions: Coordination of Care (10/09/15 1125)       Patient's SO Samuel Rhodes returned my VMMs left yesterday afternoon and again this morning.  She indicated they would like to proceed with an 11:30 appt next Monday with Dr. Alvy Bimler to address previously communicated medication needs, documentation for handicap placard.    They would also like to discuss moving forward with hospice services for ongoing support per Dr. Calton Dach suggestion.  Additionally, they would like to complete Advanced Directives during this visit.   Dr. Alvy Bimler and LCSW Polo Riley informed.  Gayleen Orem, RN, BSN, [place] at [place] [phone]                    Time Spent with Patient: 15 (10/09/15 1125)

## 2015-10-12 IMAGING — CT CT NECK W/ CM
4 of 5 series · 16 of 33 positions shown, 18 images · IV contrast (OMNIPAQUE)
Comparison: PET-CT from today reported separately.

CLINICAL DATA: 68-year-old male with right tonsillar squamous cell
carcinoma. Subsequent encounter.

EXAM:
CT NECK WITH CONTRAST
TECHNIQUE: Multidetector CT imaging of the neck was performed using the
standard protocol following the bolus administration of intravenous
contrast.
CONTRAST:  100mL OMNIPAQUE IOHEXOL 300 MG/ML  SOLN

[Series 2: neck st · axial · 0.47mm/px · z∈[-261,-111]mm · 4 of 126 slices shown]
[im 26/126  bone]
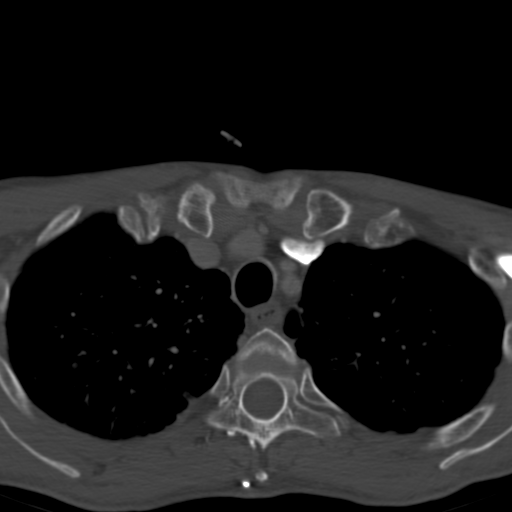
[im 51/126  bone]
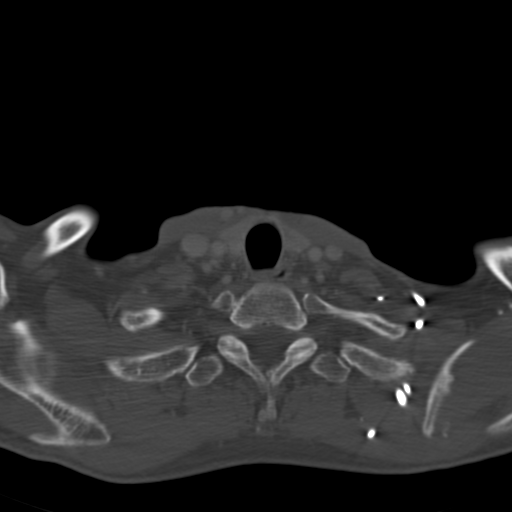
[im 76/126  bone]
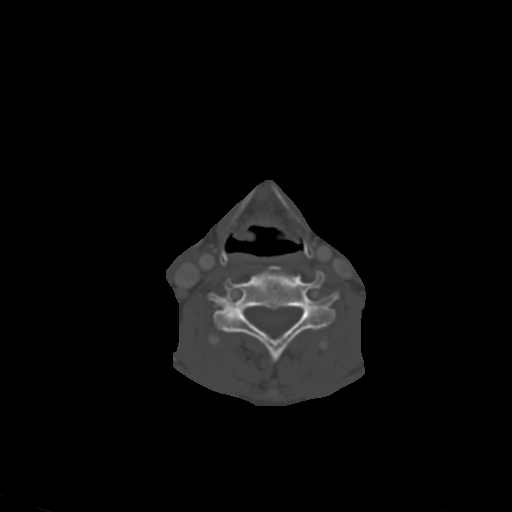
[im 101/126  bone]
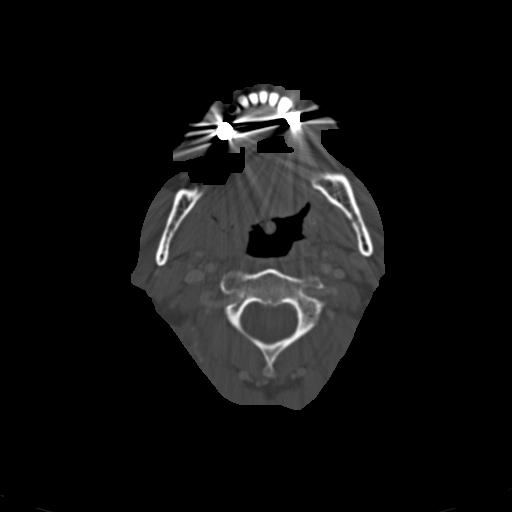

[Series 602: <mpr thick range> · sagittal · 0.49mm/px · 5 of 86 slices shown, 6 images]
[im 29/86  bone]
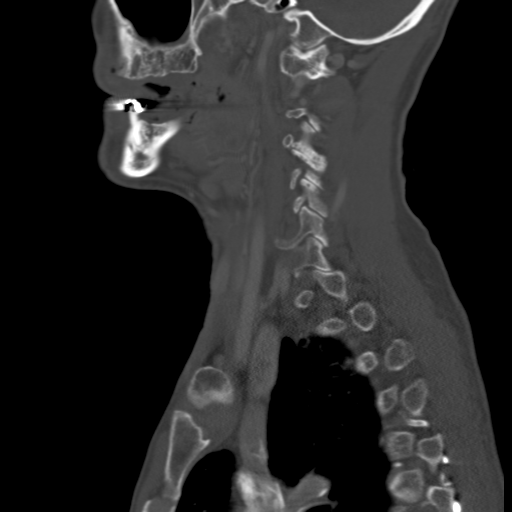
[im 36/86  bone]
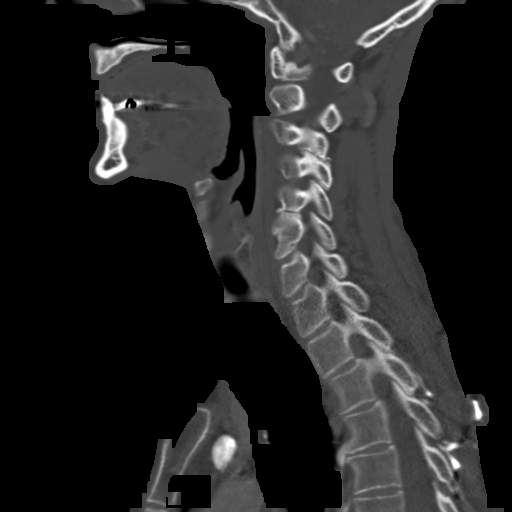
[im 43/86  soft-tissue]
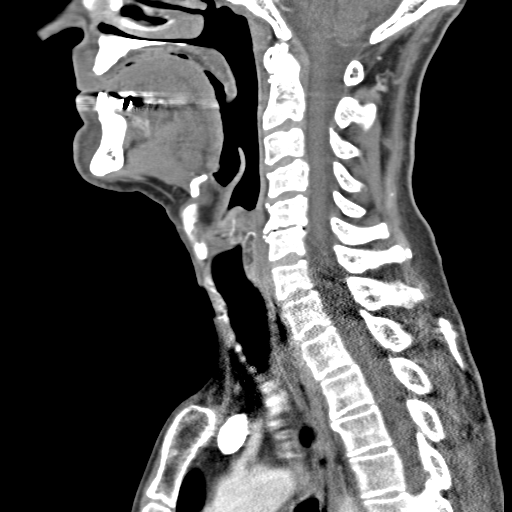
[im 43/86  bone]
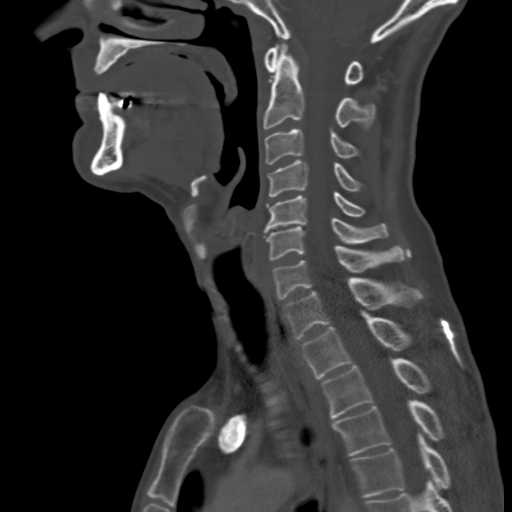
[im 50/86  bone]
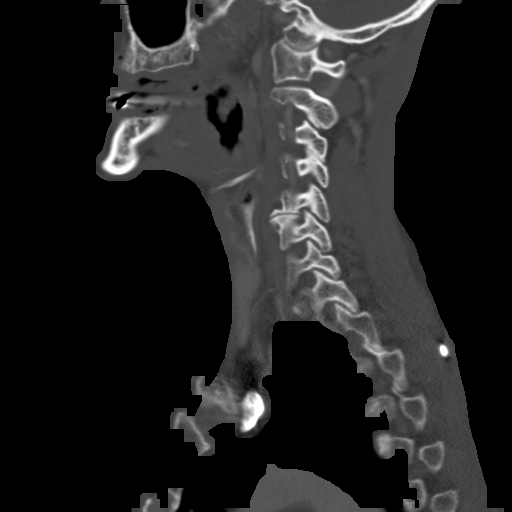
[im 57/86  bone]
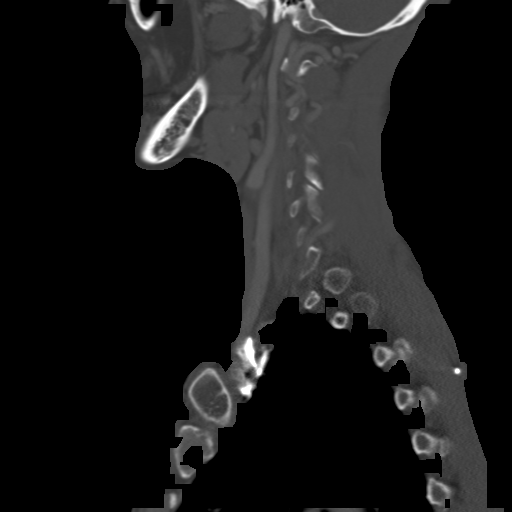

[Series 603: <mpr thick range(1)> · coronal · 0.49mm/px · 3 of 84 slices shown]
[im 17/84  bone]
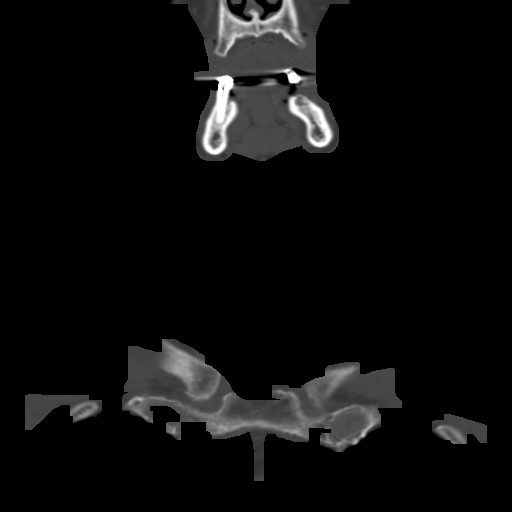
[im 34/84  bone]
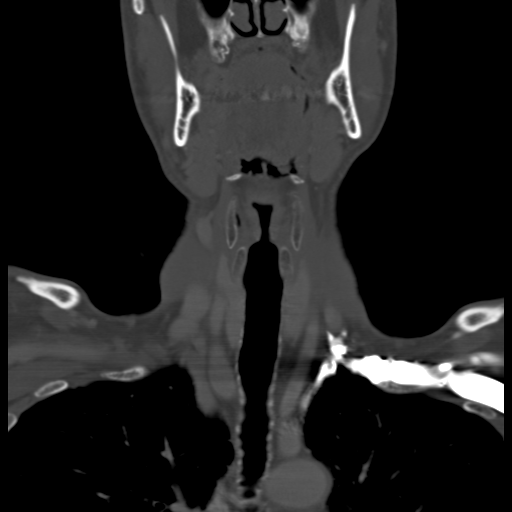
[im 50/84  bone]
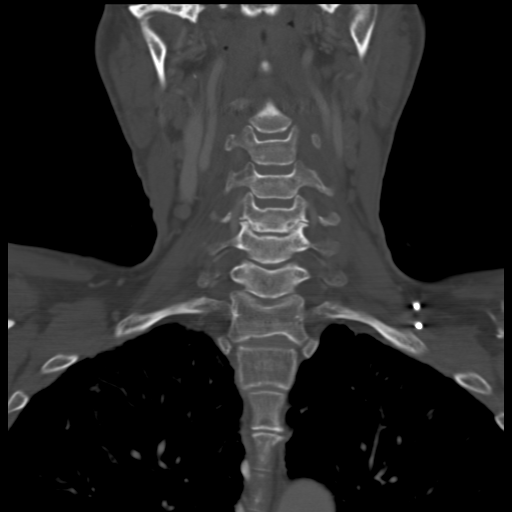

[Series 604: <mpr thick range(2)> · axial · 0.49mm/px · z∈[-302,-136]mm · 4 of 143 slices shown, 5 images]
[im 29/143  soft-tissue]
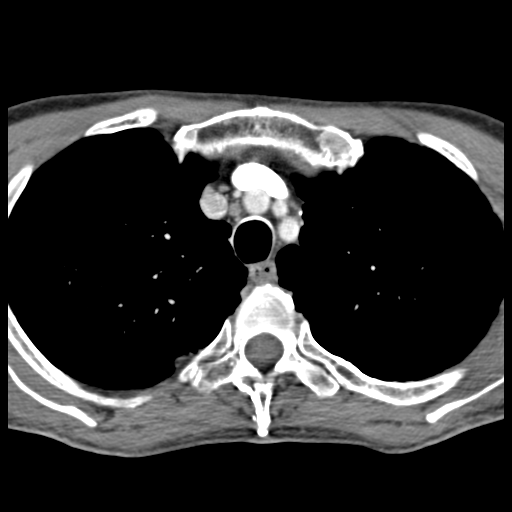
[im 29/143  bone]
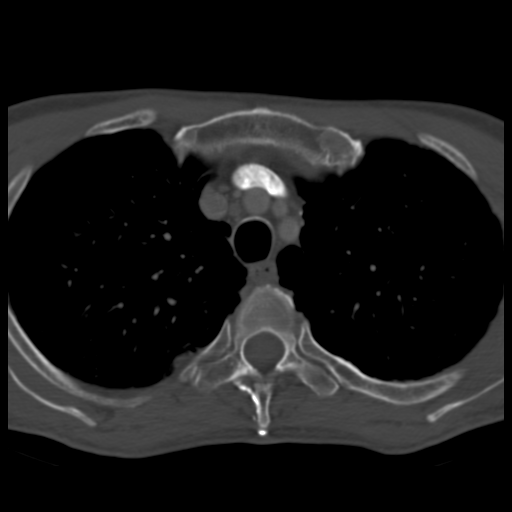
[im 57/143  bone]
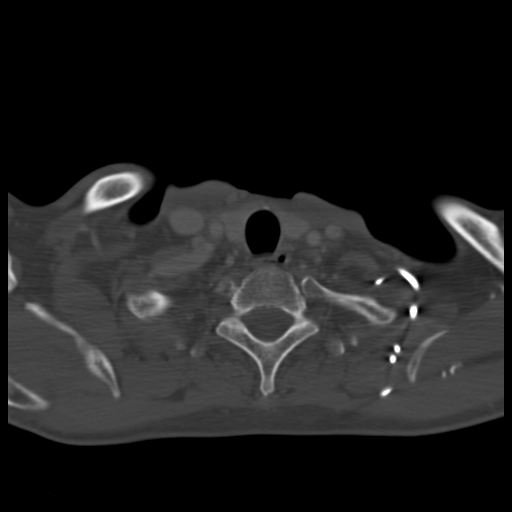
[im 86/143  bone]
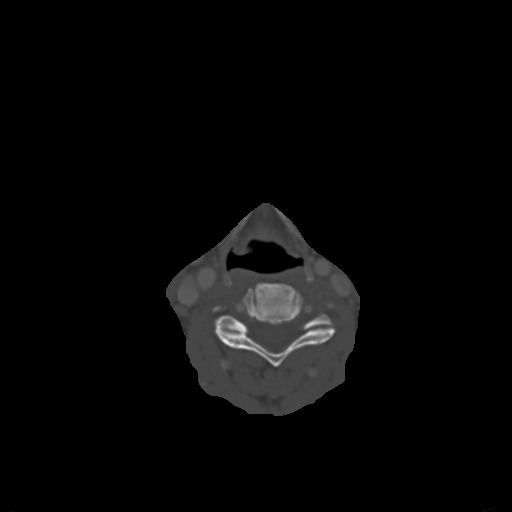
[im 114/143  bone]
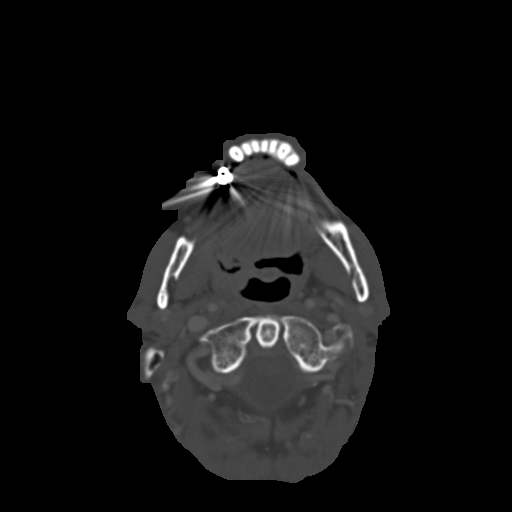

[16 of 33 positions shown; findings below may reference images not displayed]

FINDINGS: Pharynx and larynx: There is a hyper enhancing right pharyngeal mass
with indistinct margins, and some central cavitation at the level of
the soft palate. The lesion involves the anterior and lateral
pharyngeal wall from the level of the soft palate to the glossal
tonsillar sulcus (series 2, image 29). The mass encompasses roughly
33 x 28 x 37 mm (AP by transverse by CC).

Just inferior to the mass there is a small 6 mm cyst associated with
the lateral pharyngeal wall which presumably is benign (image 36).

The left pharynx, retropharyngeal space, and larynx are within
normal limits.

Salivary glands: Mild mass effect on the right submandibular gland.
The left submandibular gland, sublingual space, and both parotid
glands appear within normal limits.

Thyroid: Negative.

Lymph nodes: Malignant nodal disease with some lesions demonstrating
extracapsular extension at the right level 1B, level 2, and level 3
nodal stations. The largest nodal lesions are 2.7 cm long axis.

There are also 2 adjacent malignant nodes at the left level IIa
station, collectively measuring up to 25 mm, individually 7-8 mm
short axis.

The level 1 A nodes, level 4, level 5, and left level 3 nodes are
within normal limits.

Vascular: Major vascular structures in the neck and at the skullbase
are patent.

Limited intracranial: Negative.

Visualized orbits: Not included

Mastoids and visualized paranasal sinuses: Negative.

Skeleton: Degenerative changes in the cervical spine. No acute or
suspicious osseous lesion identified.

Upper chest: Paraseptal emphysema. Perihilar bronchiectasis. Mild to
moderate apical scarring greater on the right. No superior
mediastinal lymphadenopathy.
IMPRESSION: 1. Right side oropharyngeal carcinoma with bilateral malignant
lymphadenopathy, some extracapsular extension.
Imaging stage is T2 N2c. See also PET-CT from today reported
separately.
2. Emphysema.  Bronchiectasis.

## 2015-10-12 IMAGING — CT NM PET TUM IMG INITIAL (PI) SKULL BASE T - THIGH
8 series · 25 of 25 positions shown · non-contrast
Comparison: Today's neck CT, dictated separately.

CLINICAL DATA: Initial treatment strategy for right-sided tonsillar
mass. Squamous cell carcinoma..

EXAM:
NUCLEAR MEDICINE PET SKULL BASE TO THIGH
TECHNIQUE: 7.4 mCi F-18 FDG was injected intravenously. Full-ring PET imaging
was performed from the skull base to thigh after the radiotracer. CT
data was obtained and used for attenuation correction and anatomic
localization.
FASTING BLOOD GLUCOSE:  Value: 94 mg/dl

[Series 3: pet sk_thigh ac · axial · 5.0mm · 4.07mm/px · z∈[-1334,-482]mm · 4 of 214 slices shown]
[im 1/214]
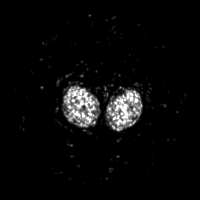
[im 72/214]
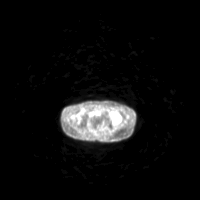
[im 143/214]
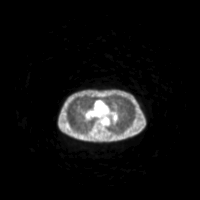
[im 214/214]
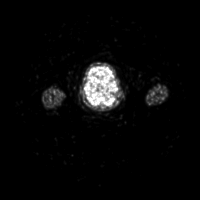

[Series 4: ct sk_thigh 5.0 b31f · axial · 5.0mm · 0.98mm/px · z∈[-1334,-482]mm · 5 of 214 slices shown]
[im 1/214]
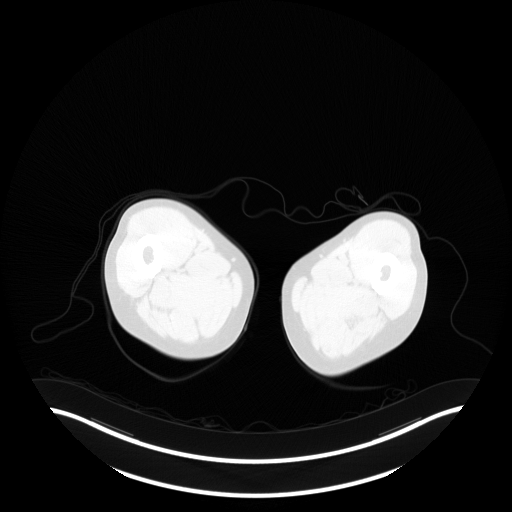
[im 54/214]
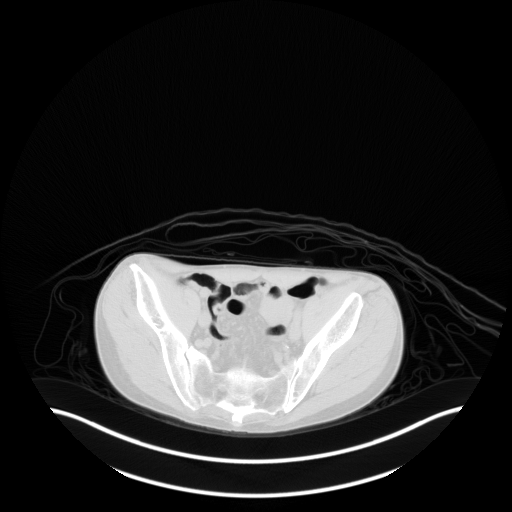
[im 107/214]
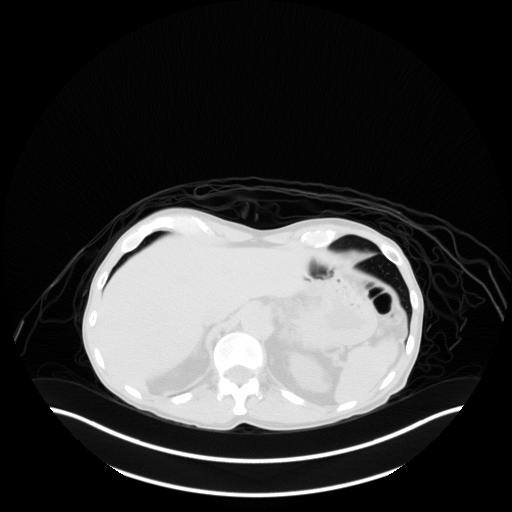
[im 160/214]
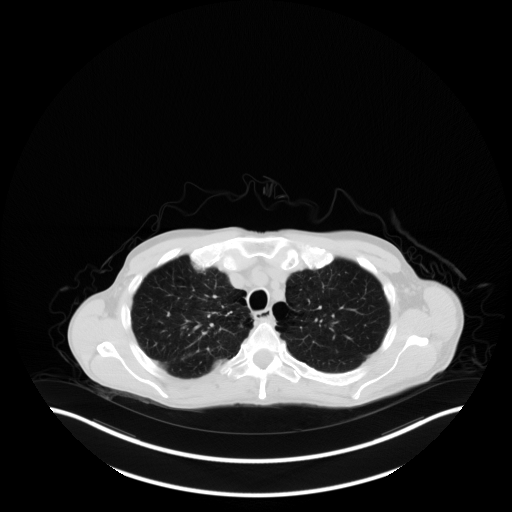
[im 214/214  brain]
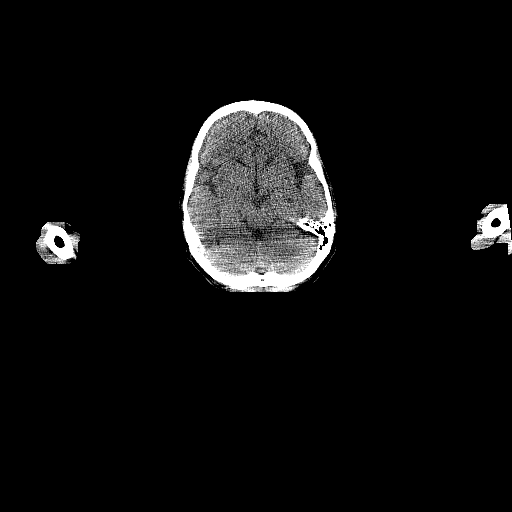

[Series 7: pet sk_thigh nac · axial · 5.0mm · 4.07mm/px · z∈[-1334,-482]mm · 5 of 214 slices shown]
[im 1/214]
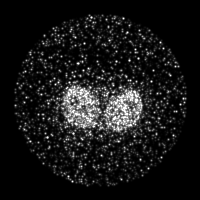
[im 54/214]
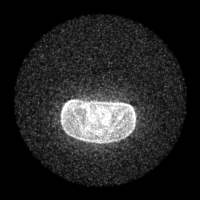
[im 107/214]
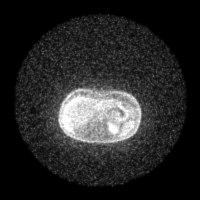
[im 160/214]
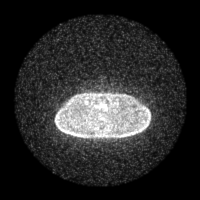
[im 214/214]
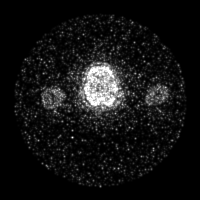

[Series 9: ct sk_thigh 5.0 b70f (id)_bone · axial · 5.0mm · 0.65mm/px · z∈[-945,-621]mm · 2 of 82 slices shown]
[im 1/82  bone]
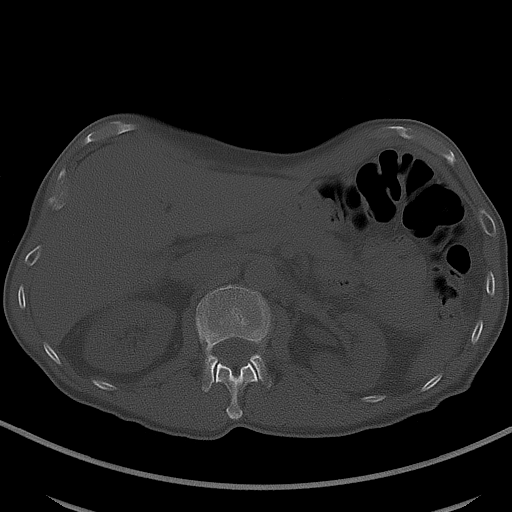
[im 82/82  bone]
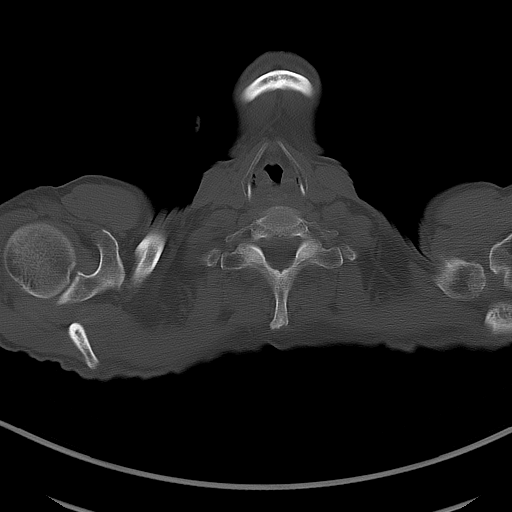

[Series 603: mip collection<mip range> · coronal · 1.77mm/px · 1 of 32 slices shown]
[im 1/32]
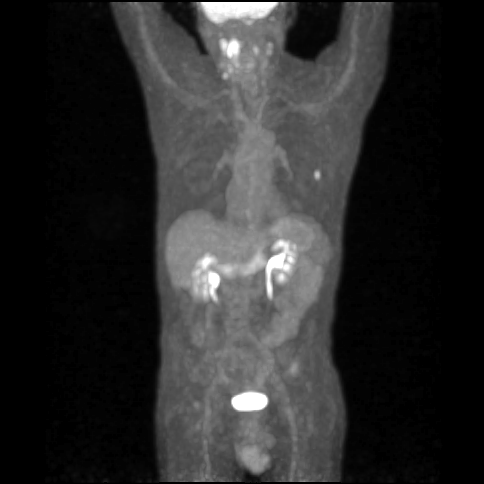

[Series 604: range-ct sk_thigh 5.0 (id)<alpha range> · 2 of 69 slices shown (1 of 2)]
[im 1/69]
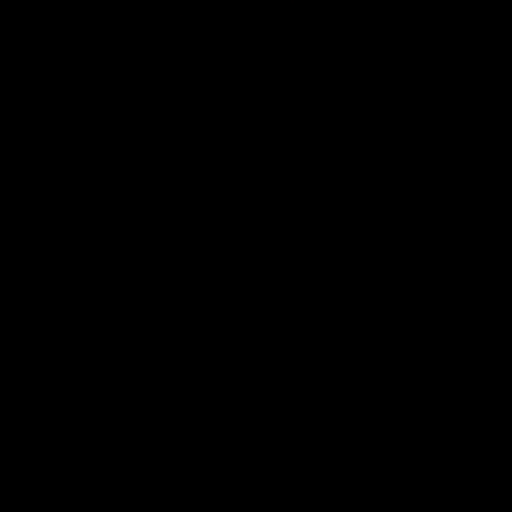
[im 69/69]
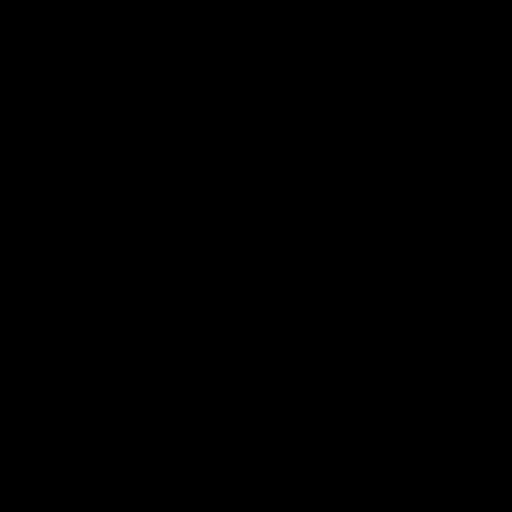

[Series 605: range-ct sk_thigh 5.0 (id)<alpha range> · 5 of 205 slices shown (2 of 2)]
[im 1/205]
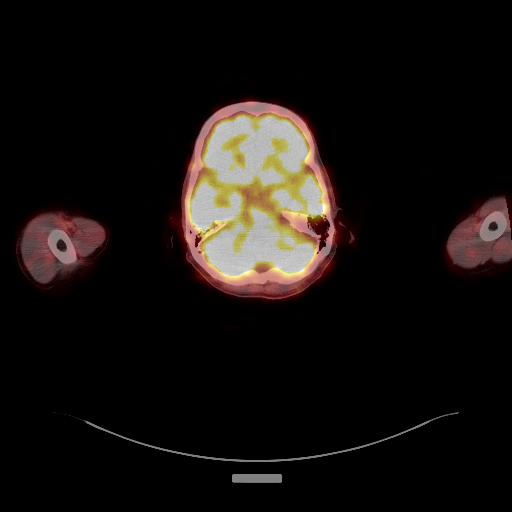
[im 52/205]
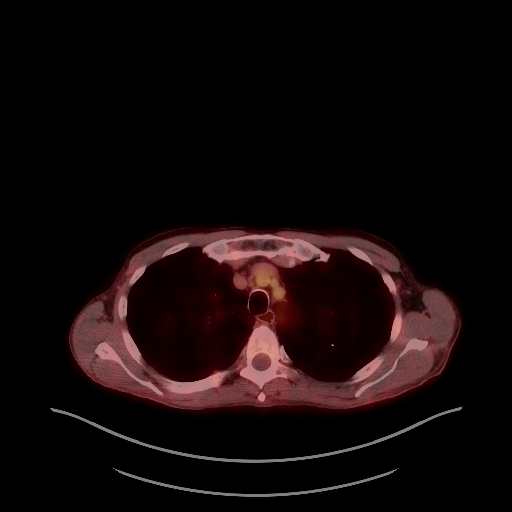
[im 103/205]
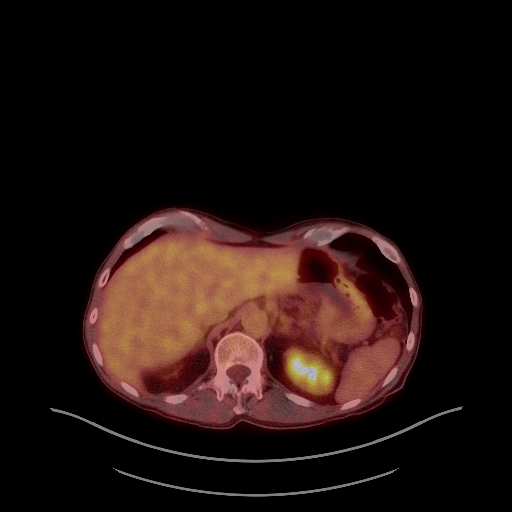
[im 154/205]
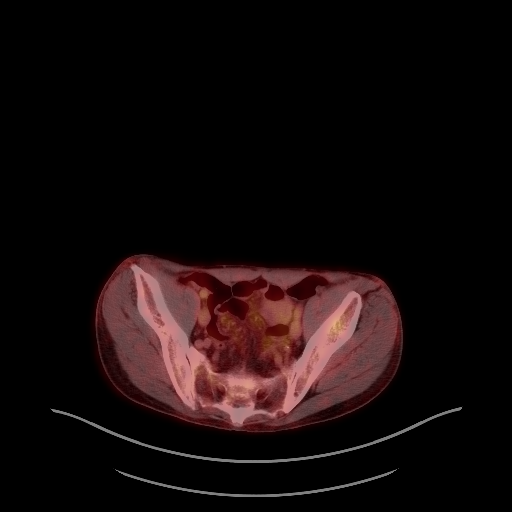
[im 205/205]
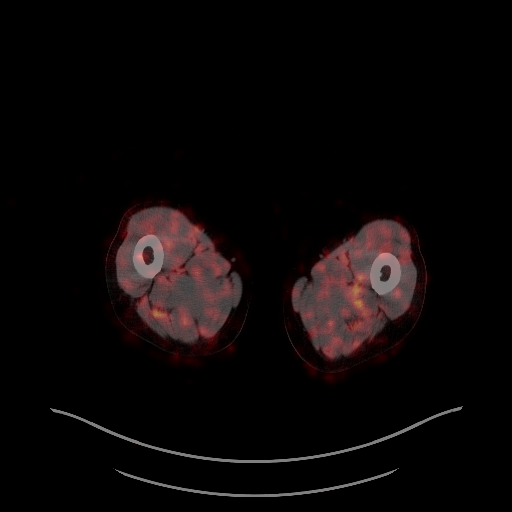

[Series 1034: results mm oncology reading · 4.0mm · 0.95mm/px · 1 of 5 slices shown]
[im 1/5]
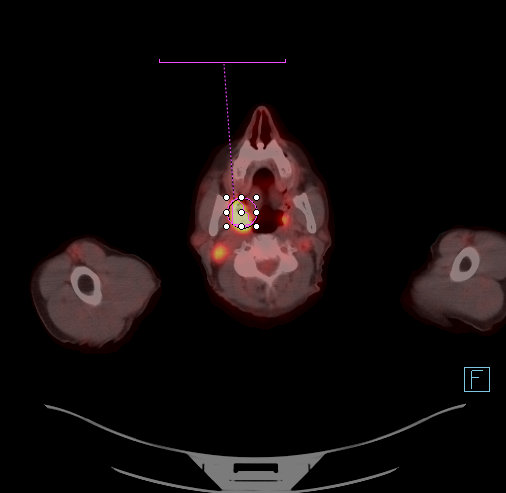

[25 of 25 positions shown; findings below may reference images not displayed]

FINDINGS: NECK

Right-sided oropharyngeal primary which measures 2.8 cm and a S.U.V.
max of 13.6 on image 21.

Right-sided level 2 node measures 1.5 cm and a S.U.V. max of 7.2 on
image 21.

A left level 2 node measures 8 mm and a S.U.V. max of 2.9 on image
21.

Partially necrotic right submandibular node which measures 1.3 cm
and a S.U.V. max of 2.8 on image 29.

A right-sided level 3 node which measures 8 mm and a S.U.V. max of
4.6 on image 40.

CHEST

No thoracic nodal hypermetabolism. A left lower lobe pulmonary
nodule measures 7 mm and a S.U.V. max of 4.8 on image 43.

A right lower lobe pulmonary nodule measures 7 mm and a S.U.V. max
of 1.3 on image 49. Both nodules have adjacent smaller nodules.

ABDOMEN/PELVIS

No areas of abnormal hypermetabolism.

SKELETON

No abnormal marrow activity.

CT IMAGES PERFORMED FOR ATTENUATION CORRECTION

Other neck findings deferred today's diagnostic CT. Normal heart
size, without pericardial effusion. Moderate centrilobular and
paraseptal emphysema. Reticular nodular opacities are also
identified within the right middle lobe. Mild pectus excavatum
deformity. Lower pole left renal lesion is likely a cyst. Abdominal
aortic and branch vessel atherosclerosis.
IMPRESSION: 1. Right-sided oropharyngeal primary with bilateral cervical nodal
metastasis.
2. Bilateral lower lobe hypermetabolic pulmonary nodules, left worse
than right. These are suspicious for pulmonary metastasis.
Synchronous primary bronchogenic carcinoma or carcinomas could look
similar. Given surrounding adjacent smaller nodules, an infectious
or inflammatory etiology (especially of the less hypermetabolic
right lower lobe nodule) is a consideration. Consider sampling of
the left lower lobe nodule.
3. Emphysema with multifocal reticular nodular pulmonary opacities,
likely due to infection or inflammation of indeterminate acuity.

## 2015-10-13 ENCOUNTER — Telehealth: Payer: Self-pay | Admitting: *Deleted

## 2015-10-13 ENCOUNTER — Ambulatory Visit (HOSPITAL_BASED_OUTPATIENT_CLINIC_OR_DEPARTMENT_OTHER): Payer: Medicare Other | Admitting: Hematology and Oncology

## 2015-10-13 VITALS — BP 116/76 | HR 71 | Temp 98.4°F | Resp 18 | Wt 119.7 lb

## 2015-10-13 DIAGNOSIS — E46 Unspecified protein-calorie malnutrition: Secondary | ICD-10-CM | POA: Diagnosis not present

## 2015-10-13 DIAGNOSIS — C099 Malignant neoplasm of tonsil, unspecified: Secondary | ICD-10-CM | POA: Diagnosis present

## 2015-10-13 DIAGNOSIS — R6884 Jaw pain: Secondary | ICD-10-CM | POA: Diagnosis not present

## 2015-10-13 DIAGNOSIS — F418 Other specified anxiety disorders: Secondary | ICD-10-CM

## 2015-10-13 DIAGNOSIS — Z515 Encounter for palliative care: Secondary | ICD-10-CM

## 2015-10-13 MED ORDER — DIAZEPAM 5 MG PO TABS: 5.0000 mg | ORAL_TABLET | Freq: Two times a day (BID) | ORAL | Status: AC | PRN

## 2015-10-13 NOTE — Telephone Encounter (Signed)
Referral called to hospice

## 2015-10-14 DIAGNOSIS — Z515 Encounter for palliative care: Secondary | ICD-10-CM | POA: Insufficient documentation

## 2015-10-14 NOTE — Assessment & Plan Note (Signed)
He has both jaw pain and throat pain with swallowing due to disease. We discussed chronic pain management. The patient is not willing to escalate his pain medication prescription but will be willing and open to suggestion after referral to palliative care. I will defer to them for chronic pain management. I did not refill his prescription today

## 2015-10-14 NOTE — Assessment & Plan Note (Signed)
We discussed CODE STATUS and he is in agreement to sign on to DO NOT RESUSCITATE orders in the future. He will get assistance from social worker today to complete his paperwork. He agreed not to pursue placement of feeding tube and other aggressive measures. He is in agreement for referral to hospice and I will get my nurse to arrange for home hospice referral

## 2015-10-14 NOTE — Assessment & Plan Note (Signed)
The patient has generalized anxiety disorder and has been taking Valium once or twice a week. I refilled his prescription today.

## 2015-10-14 NOTE — Assessment & Plan Note (Signed)
The patient had cancer cachexia and had progressive weight loss since I saw him. He admits to difficulties with chewing food and swallowing due to his disease He also has recent signs or symptoms of aspiration. Per discussion above, the patient has declined palliative care I recommend he increase oral intake as tolerated and to take pain medication before eating

## 2015-10-14 NOTE — Assessment & Plan Note (Signed)
He has made an informed decision not to pursue any form of palliative treatment and is at peace with the idea of supportive care only. Recommend that palliative care and hospice follow-up and he agreed to be referred. Per patient request, I fill-in paperwork to help him get disability parking placard I have not made further return appointment for the patient to come back

## 2015-10-14 NOTE — Progress Notes (Signed)
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  2. Bilateral lower lobe hypermetabolic pulmonary nodules, left worse than right. Suspicious for pulmonary metastasis.    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     INTERVAL HISTORY: Please see below for problem oriented charting. He returns for further follow-up and is enjoying a quiet life He is traveling around the country and visiting friends and family He is complaining of some minor throat pain which he is still taking codeine as needed He has some pain with chewing of food and difficulties with swallowing He had recurrent choking sensation when he swallows liquids with intermittent coughing He continues to have generalized anxiety disorder and has been taking Valium intermittently He has lost a lot of weight since I saw him  REVIEW OF SYSTEMS:   Constitutional: Denies fevers, chills  Eyes: Denies blurriness of vision Cardiovascular: Denies palpitation, chest discomfort or lower extremity swelling Gastrointestinal:  Denies nausea, heartburn or change in bowel habits Skin: Denies abnormal skin rashes Lymphatics: Denies new lymphadenopathy or easy bruising Neurological:Denies numbness, tingling or new  weaknesses Behavioral/Psych: Mood is stable, no new changes  All other systems were reviewed with the patient and are negative.  I have reviewed the past medical history, past surgical history, social history and family history with the patient and they are unchanged from previous note.  ALLERGIES:  has No Known Allergies.  MEDICATIONS:  Current Outpatient Prescriptions  Medication Sig Dispense Refill  . CO ENZYME Q-10 PO Take 1 capsule by mouth daily.     . codeine 15 MG tablet Take 1 tablet (15 mg total) by mouth every 6 (six) hours as needed for moderate pain. 60 tablet 0  . diazepam (VALIUM) 5 MG tablet Take 1 tablet (5 mg total) by mouth every 12 (twelve) hours as needed for anxiety. 30 tablet 0  . esomeprazole (NEXIUM) 40 MG capsule Take 40 mg by mouth daily at 12 noon.    . Ginkgo Biloba (GINKOBA PO) Take 1 tablet by mouth daily.     Marland Kitchen morphine (ROXANOL) 20 MG/ML concentrated solution Take 0.5 mLs (10 mg total) by mouth every 4 (four) hours as needed for severe pain. 240 mL 0  . OIL OF OREGANO PO Take 1 tablet by mouth daily.     . Povidone-Iodine (IODINE SOLUTION EX) Take by mouth.    . Probiotic Product (PROBIOTIC DAILY PO) Take 1 tablet by mouth daily.     Marland Kitchen UNABLE TO FIND Take 2.5 mLs by mouth daily. CHAPARRAL LEAF    . sodium fluoride (FLUORISHIELD) 1.1 % GEL dental gel Instill one drop of gel per tooth space of fluoride tray. Place over lower teeth for 5 minutes. Remove. Spit out excess. Repeat nightly. (Patient not taking: Reported on 04/07/2015) 120 mL 11   No current facility-administered medications for this visit.    PHYSICAL EXAMINATION: ECOG PERFORMANCE STATUS:  1 - Symptomatic but completely ambulatory  Filed Vitals:   10/13/15 1122  BP: 116/76  Pulse: 71  Temp: 98.4 F (36.9 C)  Resp: 18   Filed Weights   10/13/15 1122  Weight: 119 lb 11.2 oz (54.296 kg)    GENERAL:alert, no distress and comfortable. He looks thin  SKIN: skin color, texture, turgor are  normal, no rashes or significant lesions EYES: normal, Conjunctiva are pink and non-injected, sclera clear NECK:Noted swelling of the neck on the right side. Musculoskeletal:no cyanosis of digits and no clubbing  NEURO: alert & oriented x 3 with fluent speech, no focal motor/sensory deficits  LABORATORY DATA:  I have reviewed the data as listed    Component Value Date/Time   NA 137 04/07/2015 0935   K 4.6 04/07/2015 0935   CO2 27 04/07/2015 0935   GLUCOSE 92 04/07/2015 0935   BUN 24.1 04/07/2015 0935   CREATININE 1.1 04/07/2015 0935   CALCIUM 9.0 04/07/2015 0935   PROT 6.8 04/07/2015 0935   ALBUMIN 3.8 04/07/2015 0935   AST 18 04/07/2015 0935   ALT 15 04/07/2015 0935   ALKPHOS 61 04/07/2015 0935   BILITOT 0.46 04/07/2015 0935    No results found for: SPEP, UPEP  Lab Results  Component Value Date   WBC 6.8 04/07/2015   NEUTROABS 5.3 04/07/2015   HGB 13.9 04/07/2015   HCT 40.8 04/07/2015   MCV 96.0 04/07/2015   PLT 232 04/07/2015      Chemistry      Component Value Date/Time   NA 137 04/07/2015 0935   K 4.6 04/07/2015 0935   CO2 27 04/07/2015 0935   BUN 24.1 04/07/2015 0935   CREATININE 1.1 04/07/2015 0935      Component Value Date/Time   CALCIUM 9.0 04/07/2015 0935   ALKPHOS 61 04/07/2015 0935   AST 18 04/07/2015 0935   ALT 15 04/07/2015 0935   BILITOT 0.46 04/07/2015 0935      ASSESSMENT & PLAN:  Squamous cell carcinoma of tonsil He has made an informed decision not to pursue any form of palliative treatment and is at peace with the idea of supportive care only. Recommend that palliative care and hospice follow-up and he agreed to be referred. 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 He admits to difficulties with chewing food and  swallowing due to his disease He also has recent signs or symptoms of aspiration. Per discussion above, the patient has declined palliative care I recommend he increase oral intake as tolerated and to take pain medication before eating  Jaw pain He has both jaw pain and throat pain with swallowing due to disease. We discussed chronic pain management. The patient is not willing to escalate his pain medication prescription but will be willing and open to suggestion after referral to palliative care. I will defer to them for chronic pain management. I did not refill his prescription today  Other specified anxiety disorders The patient has generalized anxiety disorder and has been taking Valium once or twice a week. I refilled his prescription today.   Palliative care encounter We discussed CODE STATUS and he is in agreement to sign on to DO NOT RESUSCITATE orders in the future. He will get assistance from social worker today to complete his paperwork. He agreed  not to pursue placement of feeding tube and other aggressive measures. He is in agreement for referral to hospice and I will get my nurse to arrange for home hospice referral   No orders of the defined types were placed in this encounter.   All questions were answered. The patient knows to call the clinic with any problems, questions or concerns. No barriers to learning was detected. I spent 20 minutes counseling the patient face to face. The total time spent in the appointment was 25 minutes and more than 50% was on counseling and review of test results     [facility], [place], MD 10/14/2015 6:37 AM

## 2015-10-23 ENCOUNTER — Telehealth: Payer: Self-pay | Admitting: *Deleted

## 2015-10-23 NOTE — Telephone Encounter (Signed)
Yes ok 

## 2015-10-23 NOTE — Telephone Encounter (Signed)
Elmyra Ricks, Hospice Admissions RN,  States pt declined Hospice Services at this time.  He would like to have Palliative Care Services though.  Georgiann Cocker Verbal Order from Dr. Alvy Bimler for Palliative Care Service.

## 2015-12-23 ENCOUNTER — Encounter (HOSPITAL_BASED_OUTPATIENT_CLINIC_OR_DEPARTMENT_OTHER): Payer: Self-pay | Admitting: *Deleted

## 2015-12-23 ENCOUNTER — Observation Stay (HOSPITAL_BASED_OUTPATIENT_CLINIC_OR_DEPARTMENT_OTHER)
Admission: EM | Admit: 2015-12-23 | Discharge: 2015-12-24 | Disposition: A | Discharge status: Home / Self Care | Payer: Medicare Other | Attending: Emergency Medicine | Admitting: Emergency Medicine

## 2015-12-23 DIAGNOSIS — M25552 Pain in left hip: Secondary | ICD-10-CM | POA: Diagnosis not present

## 2015-12-23 DIAGNOSIS — M545 Low back pain: Secondary | ICD-10-CM | POA: Diagnosis not present

## 2015-12-23 DIAGNOSIS — E46 Unspecified protein-calorie malnutrition: Secondary | ICD-10-CM

## 2015-12-23 DIAGNOSIS — Z79899 Other long term (current) drug therapy: Secondary | ICD-10-CM | POA: Insufficient documentation

## 2015-12-23 DIAGNOSIS — F419 Anxiety disorder, unspecified: Secondary | ICD-10-CM | POA: Insufficient documentation

## 2015-12-23 DIAGNOSIS — D72829 Elevated white blood cell count, unspecified: Secondary | ICD-10-CM

## 2015-12-23 DIAGNOSIS — R300 Dysuria: Principal | ICD-10-CM | POA: Insufficient documentation

## 2015-12-23 DIAGNOSIS — C801 Malignant (primary) neoplasm, unspecified: Secondary | ICD-10-CM

## 2015-12-23 DIAGNOSIS — R339 Retention of urine, unspecified: Secondary | ICD-10-CM

## 2015-12-23 DIAGNOSIS — Z85818 Personal history of malignant neoplasm of other sites of lip, oral cavity, and pharynx: Secondary | ICD-10-CM | POA: Insufficient documentation

## 2015-12-23 DIAGNOSIS — N390 Urinary tract infection, site not specified: Secondary | ICD-10-CM

## 2015-12-23 DIAGNOSIS — Z87891 Personal history of nicotine dependence: Secondary | ICD-10-CM | POA: Diagnosis not present

## 2015-12-23 DIAGNOSIS — M25551 Pain in right hip: Secondary | ICD-10-CM | POA: Diagnosis not present

## 2015-12-23 MED ORDER — METHOCARBAMOL 1000 MG/10ML IJ SOLN
1000.0000 mg | Freq: Once | INTRAMUSCULAR | Status: AC
  Administered 2015-12-24: 1000 mg via INTRAVENOUS
  Filled 2015-12-23: qty 10

## 2015-12-23 MED ORDER — KETOROLAC TROMETHAMINE 30 MG/ML IJ SOLN: 30.0000 mg | Freq: Once | INTRAMUSCULAR | Status: DC

## 2015-12-23 MED ORDER — SODIUM CHLORIDE 0.9 % IV BOLUS (SEPSIS)
1000.0000 mL | Freq: Once | INTRAVENOUS | Status: AC
Start: 2015-12-23 — End: 2015-12-24
  Administered 2015-12-23: 1000 mL via INTRAVENOUS

## 2015-12-23 MED ORDER — ONDANSETRON HCL 4 MG/2ML IJ SOLN
4.0000 mg | Freq: Once | INTRAMUSCULAR | Status: AC
  Administered 2015-12-23: 4 mg via INTRAVENOUS
  Filled 2015-12-23: qty 2

## 2015-12-23 NOTE — ED Notes (Signed)
Bladder scan  523 ml urine in bladder

## 2015-12-23 NOTE — ED Notes (Signed)
MD at bedside. 

## 2015-12-23 NOTE — ED Provider Notes (Addendum)
CSN: [id number]     Arrival date & time 12/23/15  2259 History  By signing my name below, I, Evelene Croon, attest that this documentation has been prepared under the direction and in the presence of Olumide Dolinger, MD . Electronically Signed: Evelene Croon, Scribe. 12/23/2015. 11:36 PM.  Chief Complaint  Patient presents with  . Dysuria   Patient is a 70 y.o. male presenting with dysuria. The history is provided by the patient and the spouse. No language interpreter was used.  Dysuria This is a new problem. The current episode started more than 2 days ago. Pertinent negatives include no chest pain, no abdominal pain, no headaches and no shortness of breath. Nothing aggravates the symptoms. Nothing relieves the symptoms. He has tried nothing for the symptoms. The treatment provided no relief.    HPI Comments:  Aedon Percell is a 70 y.o. male who presents to the Emergency Department complaining of dysuria x 3-4 days that worsened yesterday.  Family reports associated scrotal pain, lower back pain, urinary urgency, and decreased urinary output. He has taken codeine without relief of pain. He also notes mild constipation and intermittent swelling to his bilateral feet.  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   NKDA  Past Medical History  Diagnosis Date  . 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 Lipoma excision    . Tonsil biopsy  02/17/15    squamous cell ca   Family History  Problem Relation Age of Onset  . Other Brother     severe reaction to contrast dye  . Cancer Paternal Grandfather     stomach ca   Social History  Substance Use Topics  . Smoking status: Former Smoker    Quit date: 08/09/2002  . Smokeless tobacco: Never Used  . Alcohol Use: No    Review of Systems  Constitutional: Positive for unexpected weight change. Negative for  fever.  Respiratory: Negative for shortness of breath.   Cardiovascular: Positive for leg swelling (feet). Negative for chest pain.  Gastrointestinal: Positive for constipation. Negative for abdominal pain.  Genitourinary: Positive for dysuria, urgency, difficulty urinating and testicular pain.  Musculoskeletal: Positive for back pain.  Neurological: Negative for headaches.  All other systems reviewed and are negative.  Allergies  Review of patient's allergies indicates no known allergies.  Home Medications   Prior to Admission medications   Medication Sig Start Date End Date Taking? Authorizing Provider  codeine 15 MG tablet Take 1 tablet (15 mg total) by mouth every 6 (six) hours as needed for moderate pain. 04/15/15  Yes Heath Lark, MD  CO ENZYME Q-10 PO Take 1 capsule by mouth daily.     Historical Provider, MD  diazepam (VALIUM) 5 MG tablet Take 1 tablet (5 mg total) by mouth every 12 (twelve) hours as needed for anxiety. 10/13/15   Heath Lark, MD  esomeprazole (NEXIUM) 40 MG capsule Take 40 mg by mouth daily at 12 noon.    Historical Provider, MD  Ginkgo Biloba (GINKOBA PO) Take 1 tablet by mouth daily.     Historical Provider, MD  morphine (ROXANOL) 20 MG/ML concentrated solution Take 0.5 mLs (10 mg total) by mouth every 4 (four) hours as needed for severe pain. 04/07/15   Ni Gorsuch, MD  OIL OF OREGANO PO Take 1 tablet by mouth daily.  Historical Provider, MD  Povidone-Iodine (IODINE SOLUTION EX) Take by mouth.    Historical Provider, MD  Probiotic Product (PROBIOTIC DAILY PO) Take 1 tablet by mouth daily.     Historical Provider, MD  sodium fluoride (FLUORISHIELD) 1.1 % GEL dental gel Instill one drop of gel per tooth space of fluoride tray. Place over lower teeth for 5 minutes. Remove. Spit out excess. Repeat nightly. Patient not taking: Reported on 04/07/2015 03/25/15 03/23/16  Lenn Cal, DDS  UNABLE TO FIND Take 2.5 mLs by mouth daily. [place]    Historical Provider,  MD   BP 124/79 mmHg  Pulse 77  Temp(Src) 98.4 F (36.9 C)  Resp 18  Ht 5\' 10"  (1.778 m)  Wt 102 lb (46.267 kg)  BMI 14.64 kg/m2  SpO2 100% Physical Exam  Constitutional: He is oriented to person, place, and time. He appears well-developed and well-nourished. No distress.  HENT:  Head: Normocephalic and atraumatic.  Mouth/Throat: Oropharynx is clear and moist. No oropharyngeal exudate.  Moist mucous membranes   Eyes: Conjunctivae and EOM are normal.  Pupils are pinpoint  Neck: Normal range of motion. Neck supple.  Trachea midline Several enlarged cervical submandibular node on the right  No stridor or bruit   Cardiovascular: Normal rate, regular rhythm, normal heart sounds and intact distal pulses.   Pulses:      Dorsalis pedis pulses are 2+ on the right side, and 2+ on the left side.  Pulmonary/Chest: Effort normal and breath sounds normal. No stridor. No respiratory distress. He has no wheezes. He has no rales.  Abdominal: Soft. Bowel sounds are normal. He exhibits no distension. There is no tenderness. There is no rebound and no guarding.  Musculoskeletal: He exhibits edema. He exhibits no tenderness.  No cords; all compartments are soft   Neurological: He is alert and oriented to person, place, and time. He has normal reflexes.  Skin: Skin is warm and dry.  Psychiatric: He has a normal mood and affect. His behavior is normal.  Nursing note and vitals reviewed.   ED Course  Procedures  DIAGNOSTIC STUDIES:  Oxygen Saturation is 100% on RA, normal by my interpretation.    COORDINATION OF CARE:  11:29 PM Discussed treatment plan with pt at bedside and pt agreed to plan.  Labs Review Labs Reviewed  CBC WITH DIFFERENTIAL/PLATELET  COMPREHENSIVE METABOLIC PANEL  URINALYSIS, ROUTINE W REFLEX MICROSCOPIC (NOT AT [facility])    Imaging Review No results found. I have personally reviewed and evaluated these images and lab results as part of my medical decision-making.    EKG Interpretation None      MDM   Final diagnoses:  None   Filed Vitals:   12/24/15 0500 12/24/15 0505  BP: 86/44   Pulse: 58   Temp:  98.4 F (36.9 C)  Resp:  18   Results for orders placed or performed during the hospital encounter of 12/23/15  CBC with Differential/Platelet  Result Value Ref Range   WBC 25.3 (H) 4.0 - 10.5 K/uL   RBC 3.46 (L) 4.22 - 5.81 MIL/uL   Hemoglobin 11.3 (L) 13.0 - 17.0 g/dL   HCT 33.7 (L) 39.0 - 52.0 %   MCV 97.4 78.0 - 100.0 fL   MCH 32.7 26.0 - 34.0 pg   MCHC 33.5 30.0 - 36.0 g/dL   RDW 15.0 11.5 - 15.5 %   Platelets 263 150 - 400 K/uL   Neutrophils Relative % 91 %   Lymphocytes Relative 0 %  Monocytes Relative 2 %   Eosinophils Relative 0 %   Basophils Relative 0 %   Band Neutrophils 7 %   Metamyelocytes Relative 0 %   Myelocytes 0 %   Promyelocytes Absolute 0 %   Blasts 0 %   nRBC 0 0 /100 WBC   Neutro Abs 24.8 (H) 1.7 - 7.7 K/uL   Lymphs Abs 0.0 (L) 0.7 - 4.0 K/uL   Monocytes Absolute 0.5 0.1 - 1.0 K/uL   Eosinophils Absolute 0.0 0.0 - 0.7 K/uL   Basophils Absolute 0.0 0.0 - 0.1 K/uL   WBC Morphology MILD LEFT SHIFT (1-5% METAS, OCC MYELO, OCC BANDS)   Comprehensive metabolic panel  Result Value Ref Range   Sodium 133 (L) 135 - 145 mmol/L   Potassium 4.4 3.5 - 5.1 mmol/L   Chloride 94 (L) 101 - 111 mmol/L   CO2 31 22 - 32 mmol/L   Glucose, Bld 119 (H) 65 - 99 mg/dL   BUN 23 (H) 6 - 20 mg/dL   Creatinine, Ser 0.69 0.61 - 1.24 mg/dL   Calcium 8.6 (L) 8.9 - 10.3 mg/dL   Total Protein 6.2 (L) 6.5 - 8.1 g/dL   Albumin 2.5 (L) 3.5 - 5.0 g/dL   AST 33 15 - 41 U/L   ALT 30 17 - 63 U/L   Alkaline Phosphatase 86 38 - 126 U/L   Total Bilirubin 0.6 0.3 - 1.2 mg/dL   GFR calc non Af Amer >60 >60 mL/min   GFR calc Af Amer >60 >60 mL/min   Anion gap 8 5 - 15  Protime-INR  Result Value Ref Range   Prothrombin Time 15.2 11.6 - 15.2 seconds   INR 1.18 0.00 - 1.49  Urinalysis, Routine w reflex microscopic (not at [facility])  Result  Value Ref Range   Color, Urine YELLOW YELLOW   APPearance CLEAR CLEAR   Specific Gravity, Urine 1.024 1.005 - 1.030   pH 6.0 5.0 - 8.0   Glucose, UA NEGATIVE NEGATIVE mg/dL   Hgb urine dipstick LARGE (A) NEGATIVE   Bilirubin Urine NEGATIVE NEGATIVE   Ketones, ur NEGATIVE NEGATIVE mg/dL   Protein, ur 30 (A) NEGATIVE mg/dL   Nitrite NEGATIVE NEGATIVE   Leukocytes, UA SMALL (A) NEGATIVE  Urine microscopic-add on  Result Value Ref Range   Squamous Epithelial / LPF 0-5 (A) NONE SEEN   WBC, UA 6-30 0 - 5 WBC/hpf   RBC / HPF 6-30 0 - 5 RBC/hpf   Bacteria, UA MANY (A) NONE SEEN   Urine-Other MUCOUS PRESENT    Dg Lumbar Spine Complete  12/24/2015  CLINICAL DATA:  Bilateral hip pain and low back pain.  No trauma. EXAM: LUMBAR SPINE - COMPLETE 4+ VIEW COMPARISON:  None. FINDINGS: There is no evidence of lumbar spine fracture. Alignment is normal. Intervertebral disc spaces are maintained. Incidental note of somewhat prominent gas-filled colon suggesting ileus. IMPRESSION: No acute bony abnormalities. Electronically Signed   By: Lucienne Capers M.D.   On: 12/24/2015 04:29   Dg Hips Bilat With Pelvis 3-4 Views  12/24/2015  CLINICAL DATA:  Bilateral hip pain and low back pain.  No trauma. EXAM: DG HIP (WITH OR WITHOUT PELVIS) 3-4V BILAT COMPARISON:  None. FINDINGS: There is no evidence of hip fracture or dislocation. There is no evidence of arthropathy or other focal bone abnormality. Incidental note of prominent gas-filled colon suggesting ileus. IMPRESSION: No acute bony abnormalities. Electronically Signed   By: Lucienne Capers M.D.   On: 12/24/2015 04:28  Medications  ketorolac (TORADOL) 30 MG/ML injection 30 mg (not administered)  sodium chloride 0.9 % bolus 1,000 mL (0 mLs Intravenous Stopped 12/24/15 0419)  ondansetron (ZOFRAN) injection 4 mg (4 mg Intravenous Given 12/23/15 2339)  methocarbamol (ROBAXIN) injection 1,000 mg (1,000 mg Intravenous Given 12/24/15 0135)  lidocaine (XYLOCAINE) 2  % jelly (1 application  Given [id number])  sodium chloride 0.9 % bolus 1,500 mL (1,500 mLs Intravenous New Bag/Given 12/24/15 0504)   Suspect BP is lowish at baseline and was falsely elevated due to pain and urinary retention on arrival.   Effects of  2 codeine taken by patient just PTA and then relief of bladder obstruction have made patient BP where or close to where it lives.  Given 2 30 cc/kg bolus   Will admit for IV antibiotics, IVF and palliative care consult   I personally performed the services described in this documentation, which was scribed in my presence. The recorded information has been reviewed and is accurate.      Veatrice Kells, MD 12/24/15 [id number]  Veatrice Kells, MD 12/24/15 [phone]

## 2015-12-23 NOTE — ED Notes (Signed)
Pt c/o freq painful urination with scrotum pain x 3 days

## 2015-12-23 NOTE — ED Notes (Signed)
Family at bedside. 

## 2015-12-24 ENCOUNTER — Emergency Department (HOSPITAL_BASED_OUTPATIENT_CLINIC_OR_DEPARTMENT_OTHER): Payer: Medicare Other

## 2015-12-24 ENCOUNTER — Telehealth (HOSPITAL_BASED_OUTPATIENT_CLINIC_OR_DEPARTMENT_OTHER): Payer: Self-pay | Admitting: Emergency Medicine

## 2015-12-24 ENCOUNTER — Encounter (HOSPITAL_BASED_OUTPATIENT_CLINIC_OR_DEPARTMENT_OTHER): Payer: Self-pay | Admitting: Emergency Medicine

## 2015-12-24 DIAGNOSIS — N390 Urinary tract infection, site not specified: Secondary | ICD-10-CM | POA: Diagnosis present

## 2015-12-24 LAB — URINE MICROSCOPIC-ADD ON

## 2015-12-24 LAB — CBC WITH DIFFERENTIAL/PLATELET
BASOS ABS: 0 10*3/uL (ref 0.0–0.1)
BASOS PCT: 0 %
BLASTS: 0 %
Band Neutrophils: 7 %
EOS ABS: 0 10*3/uL (ref 0.0–0.7)
Eosinophils Relative: 0 %
HEMATOCRIT: 33.7 % — AB (ref 39.0–52.0)
HEMOGLOBIN: 11.3 g/dL — AB (ref 13.0–17.0)
LYMPHS PCT: 0 %
Lymphs Abs: 0 10*3/uL — ABNORMAL LOW (ref 0.7–4.0)
MCH: 32.7 pg (ref 26.0–34.0)
MCHC: 33.5 g/dL (ref 30.0–36.0)
MCV: 97.4 fL (ref 78.0–100.0)
MYELOCYTES: 0 %
Metamyelocytes Relative: 0 %
Monocytes Absolute: 0.5 10*3/uL (ref 0.1–1.0)
Monocytes Relative: 2 %
NEUTROS ABS: 24.8 10*3/uL — AB (ref 1.7–7.7)
NEUTROS PCT: 91 %
NRBC: 0 /100{WBCs}
PROMYELOCYTES ABS: 0 %
Platelets: 263 10*3/uL (ref 150–400)
RBC: 3.46 MIL/uL — ABNORMAL LOW (ref 4.22–5.81)
RDW: 15 % (ref 11.5–15.5)
WBC: 25.3 10*3/uL — AB (ref 4.0–10.5)

## 2015-12-24 LAB — PROTIME-INR
INR: 1.18 (ref 0.00–1.49)
Prothrombin Time: 15.2 seconds (ref 11.6–15.2)

## 2015-12-24 LAB — URINALYSIS, ROUTINE W REFLEX MICROSCOPIC
Bilirubin Urine: NEGATIVE
Glucose, UA: NEGATIVE mg/dL
Ketones, ur: NEGATIVE mg/dL
Nitrite: NEGATIVE
PH: 6 (ref 5.0–8.0)
Protein, ur: 30 mg/dL — AB
SPECIFIC GRAVITY, URINE: 1.024 (ref 1.005–1.030)

## 2015-12-24 LAB — I-STAT CG4 LACTIC ACID, ED: Lactic Acid, Venous: 0.87 mmol/L (ref 0.5–2.0)

## 2015-12-24 LAB — COMPREHENSIVE METABOLIC PANEL
ALBUMIN: 2.5 g/dL — AB (ref 3.5–5.0)
ALK PHOS: 86 U/L (ref 38–126)
ALT: 30 U/L (ref 17–63)
ANION GAP: 8 (ref 5–15)
AST: 33 U/L (ref 15–41)
BILIRUBIN TOTAL: 0.6 mg/dL (ref 0.3–1.2)
BUN: 23 mg/dL — AB (ref 6–20)
CALCIUM: 8.6 mg/dL — AB (ref 8.9–10.3)
CO2: 31 mmol/L (ref 22–32)
Chloride: 94 mmol/L — ABNORMAL LOW (ref 101–111)
Creatinine, Ser: 0.69 mg/dL (ref 0.61–1.24)
GFR calc Af Amer: >60 mL/min (ref >60)
GFR calc non Af Amer: >60 mL/min (ref >60)
GLUCOSE: 119 mg/dL — AB (ref 65–99)
Potassium: 4.4 mmol/L (ref 3.5–5.1)
SODIUM: 133 mmol/L — AB (ref 135–145)
TOTAL PROTEIN: 6.2 g/dL — AB (ref 6.5–8.1)

## 2015-12-24 MED ORDER — TAMSULOSIN HCL 0.4 MG PO CAPS
0.4000 mg | ORAL_CAPSULE | Freq: Every day | ORAL | Status: AC
Start: 2015-12-24 — End: ?

## 2015-12-24 MED ORDER — LIDOCAINE HCL 2 % EX GEL
CUTANEOUS | Status: AC
  Administered 2015-12-24: 1
  Filled 2015-12-24: qty 20

## 2015-12-24 MED ORDER — SODIUM CHLORIDE 0.9 % IV BOLUS (SEPSIS)
1500.0000 mL | Freq: Once | INTRAVENOUS | Status: AC
  Administered 2015-12-24: 1500 mL via INTRAVENOUS

## 2015-12-24 MED ORDER — CEPHALEXIN 500 MG PO CAPS: 500.0000 mg | ORAL_CAPSULE | Freq: Four times a day (QID) | ORAL | Status: AC

## 2015-12-24 MED ORDER — DEXTROSE 5 % IV SOLN
1.0000 g | Freq: Once | INTRAVENOUS | Status: AC
  Administered 2015-12-24: 1 g via INTRAVENOUS
  Filled 2015-12-24: qty 10

## 2015-12-24 MED FILL — TAMSULOSIN HCL 0.4 MG CAP: 0.4 | 30 days supply | Qty: 30 | Fill #0

## 2015-12-24 MED FILL — CEPHALEXIN 500 MG CAPSULE: 500 | 10 days supply | Qty: 40 | Fill #0

## 2015-12-24 NOTE — ED Notes (Signed)
Pt and wife given teaching on changing leg bag to f/c bag. Teaching on foley care and importance of keeping ends of foley bags sterile. Also teaching on keeping bag below level of bladder at all times. Pt and wife verbilized understanding.

## 2015-12-24 NOTE — ED Provider Notes (Signed)
Patient's daughter, Joss, who is an Therapist, sports here was in to see the patient.   She states she feels he looks better and the family has made a decision together that they wish to be discharged home on oral antibiotics and will follow up with palliative care and urology regarding urinary retention.   Patient is instructed to return to the closest ED for fevers, vomiting weakness or any concerns.    Veatrice Kells, MD 12/24/15 [phone]

## 2015-12-24 NOTE — ED Notes (Signed)
Pt and family given food

## 2015-12-24 NOTE — Progress Notes (Signed)
This is a no charge note  Transfer from a [facility] per Dr. 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 X-ray of the pelvis and lumbar negative. Found to have positive urinalysis with small amount of leukocytes, hypotension with blood pressure 86/44, WBC 25.3. INR 1.18, renal function normal. Blood pressure responded to IV fluid resuscitation, improved to 97/61 after 3 L of normal saline bolus. IV Rocephin was started. Pt is accepted to tele as obs.  Ivor Costa, MD  [facility] Pager [phone]  If 7PM-7AM, please contact night-coverage [email or web address] Password [email or web address] 12/24/2015, 5:52 AM

## 2015-12-30 ENCOUNTER — Inpatient Hospital Stay (HOSPITAL_COMMUNITY): Payer: Medicare Other

## 2015-12-30 ENCOUNTER — Inpatient Hospital Stay (HOSPITAL_COMMUNITY)
Admission: AD | Admit: 2015-12-30 | Discharge: 2016-01-08 | DRG: 871 | Disposition: E | Payer: Medicare Other | Source: Other Acute Inpatient Hospital | Attending: Pulmonary Disease | Admitting: Pulmonary Disease

## 2015-12-30 ENCOUNTER — Encounter (HOSPITAL_COMMUNITY): Payer: Self-pay

## 2015-12-30 DIAGNOSIS — Z789 Other specified health status: Secondary | ICD-10-CM

## 2015-12-30 DIAGNOSIS — E876 Hypokalemia: Secondary | ICD-10-CM | POA: Diagnosis present

## 2015-12-30 DIAGNOSIS — J189 Pneumonia, unspecified organism: Secondary | ICD-10-CM | POA: Diagnosis present

## 2015-12-30 DIAGNOSIS — N390 Urinary tract infection, site not specified: Secondary | ICD-10-CM | POA: Diagnosis present

## 2015-12-30 DIAGNOSIS — Y95 Nosocomial condition: Secondary | ICD-10-CM | POA: Diagnosis present

## 2015-12-30 DIAGNOSIS — E872 Acidosis: Secondary | ICD-10-CM | POA: Diagnosis present

## 2015-12-30 DIAGNOSIS — R64 Cachexia: Secondary | ICD-10-CM | POA: Diagnosis present

## 2015-12-30 DIAGNOSIS — Z66 Do not resuscitate: Secondary | ICD-10-CM | POA: Diagnosis not present

## 2015-12-30 DIAGNOSIS — E861 Hypovolemia: Secondary | ICD-10-CM | POA: Diagnosis present

## 2015-12-30 DIAGNOSIS — J9602 Acute respiratory failure with hypercapnia: Secondary | ICD-10-CM | POA: Diagnosis present

## 2015-12-30 DIAGNOSIS — C099 Malignant neoplasm of tonsil, unspecified: Secondary | ICD-10-CM | POA: Diagnosis present

## 2015-12-30 DIAGNOSIS — E162 Hypoglycemia, unspecified: Secondary | ICD-10-CM | POA: Diagnosis present

## 2015-12-30 DIAGNOSIS — D696 Thrombocytopenia, unspecified: Secondary | ICD-10-CM | POA: Diagnosis present

## 2015-12-30 DIAGNOSIS — A419 Sepsis, unspecified organism: Principal | ICD-10-CM | POA: Diagnosis present

## 2015-12-30 DIAGNOSIS — C7800 Secondary malignant neoplasm of unspecified lung: Secondary | ICD-10-CM | POA: Diagnosis present

## 2015-12-30 DIAGNOSIS — J9601 Acute respiratory failure with hypoxia: Secondary | ICD-10-CM | POA: Diagnosis present

## 2015-12-30 DIAGNOSIS — Z79899 Other long term (current) drug therapy: Secondary | ICD-10-CM

## 2015-12-30 DIAGNOSIS — R627 Adult failure to thrive: Secondary | ICD-10-CM | POA: Diagnosis present

## 2015-12-30 DIAGNOSIS — G9340 Encephalopathy, unspecified: Secondary | ICD-10-CM

## 2015-12-30 DIAGNOSIS — L8990 Pressure ulcer of unspecified site, unspecified stage: Secondary | ICD-10-CM | POA: Insufficient documentation

## 2015-12-30 DIAGNOSIS — C799 Secondary malignant neoplasm of unspecified site: Secondary | ICD-10-CM | POA: Insufficient documentation

## 2015-12-30 DIAGNOSIS — R6521 Severe sepsis with septic shock: Secondary | ICD-10-CM | POA: Diagnosis present

## 2015-12-30 DIAGNOSIS — E43 Unspecified severe protein-calorie malnutrition: Secondary | ICD-10-CM | POA: Diagnosis present

## 2015-12-30 DIAGNOSIS — Z978 Presence of other specified devices: Secondary | ICD-10-CM | POA: Insufficient documentation

## 2015-12-30 DIAGNOSIS — I4891 Unspecified atrial fibrillation: Secondary | ICD-10-CM | POA: Diagnosis present

## 2015-12-30 DIAGNOSIS — Z681 Body mass index (BMI) 19 or less, adult: Secondary | ICD-10-CM

## 2015-12-30 DIAGNOSIS — Z515 Encounter for palliative care: Secondary | ICD-10-CM | POA: Diagnosis not present

## 2015-12-30 DIAGNOSIS — Z87891 Personal history of nicotine dependence: Secondary | ICD-10-CM | POA: Diagnosis not present

## 2015-12-30 LAB — CBC WITH DIFFERENTIAL/PLATELET
BASOS ABS: 0 10*3/uL (ref 0.0–0.1)
Basophils Relative: 0 %
EOS ABS: 0 10*3/uL (ref 0.0–0.7)
Eosinophils Relative: 0 %
HCT: 35.8 % — ABNORMAL LOW (ref 39.0–52.0)
Hemoglobin: 11.6 g/dL — ABNORMAL LOW (ref 13.0–17.0)
LYMPHS ABS: 0.1 10*3/uL — AB (ref 0.7–4.0)
LYMPHS PCT: 3 %
MCH: 31.3 pg (ref 26.0–34.0)
MCHC: 32.4 g/dL (ref 30.0–36.0)
MCV: 96.5 fL (ref 78.0–100.0)
Monocytes Absolute: 0 10*3/uL — ABNORMAL LOW (ref 0.1–1.0)
Monocytes Relative: 1 %
NEUTROS ABS: 4.5 10*3/uL (ref 1.7–7.7)
Neutrophils Relative %: 96 %
PLATELETS: 91 10*3/uL — AB (ref 150–400)
RBC: 3.71 MIL/uL — AB (ref 4.22–5.81)
RDW: 15.3 % (ref 11.5–15.5)
WBC: 4.6 10*3/uL (ref 4.0–10.5)

## 2015-12-30 LAB — COMPREHENSIVE METABOLIC PANEL
ALBUMIN: 1.2 g/dL — AB (ref 3.5–5.0)
ALT: 26 U/L (ref 17–63)
ANION GAP: 7 (ref 5–15)
AST: 36 U/L (ref 15–41)
Alkaline Phosphatase: 99 U/L (ref 38–126)
BILIRUBIN TOTAL: 0.5 mg/dL (ref 0.3–1.2)
BUN: 23 mg/dL — ABNORMAL HIGH (ref 6–20)
CHLORIDE: 106 mmol/L (ref 101–111)
CO2: 25 mmol/L (ref 22–32)
Calcium: 7.4 mg/dL — ABNORMAL LOW (ref 8.9–10.3)
Creatinine, Ser: 0.59 mg/dL — ABNORMAL LOW (ref 0.61–1.24)
GFR calc Af Amer: >60 mL/min (ref >60)
GFR calc non Af Amer: >60 mL/min (ref >60)
GLUCOSE: 97 mg/dL (ref 65–99)
POTASSIUM: 3.1 mmol/L — AB (ref 3.5–5.1)
SODIUM: 138 mmol/L (ref 135–145)
TOTAL PROTEIN: 3.9 g/dL — AB (ref 6.5–8.1)

## 2015-12-30 LAB — POCT I-STAT 3, ART BLOOD GAS (G3+)
Acid-base deficit: 5 mmol/L — ABNORMAL HIGH (ref 0.0–2.0)
Acid-base deficit: 4 mmol/L — ABNORMAL HIGH (ref 0.0–2.0)
BICARBONATE: 22.2 meq/L (ref 20.0–24.0)
BICARBONATE: 21.6 meq/L (ref 20.0–24.0)
O2 Saturation: 86 %
O2 Saturation: 90 %
PCO2 ART: 39 mmHg (ref 35.0–45.0)
PH ART: 7.347 — AB (ref 7.350–7.450)
PO2 ART: 59 mmHg — AB (ref 80.0–100.0)
PO2 ART: 59 mmHg — AB (ref 80.0–100.0)
Patient temperature: 98.6
Patient temperature: 97.4
TCO2: 24 mmol/L (ref 0–100)
TCO2: 23 mmol/L (ref 0–100)
pCO2 arterial: 49.3 mmHg — ABNORMAL HIGH (ref 35.0–45.0)
pH, Arterial: 7.261 — ABNORMAL LOW (ref 7.350–7.450)

## 2015-12-30 LAB — BLOOD GAS, ARTERIAL
ACID-BASE DEFICIT: 3.4 mmol/L — AB (ref 0.0–2.0)
Bicarbonate: 21.9 mEq/L (ref 20.0–24.0)
Drawn by: 448981
FIO2: 0.8
O2 SAT: 97.5 %
PCO2 ART: 43.3 mmHg (ref 35.0–45.0)
PEEP/CPAP: 15 cmH2O
PH ART: 7.319 — AB (ref 7.350–7.450)
Patient temperature: 97.4
RATE: 22 resp/min
TCO2: 23.2 mmol/L (ref 0–100)
VT: 550 mL
pO2, Arterial: 111 mmHg — ABNORMAL HIGH (ref 80.0–100.0)

## 2015-12-30 LAB — PROCALCITONIN: PROCALCITONIN: 12.94 ng/mL

## 2015-12-30 LAB — POCT I-STAT 3, VENOUS BLOOD GAS (G3P V)
ACID-BASE DEFICIT: 2 mmol/L (ref 0.0–2.0)
Bicarbonate: 23.7 mEq/L (ref 20.0–24.0)
O2 SAT: 59 %
TCO2: 25 mmol/L (ref 0–100)
pCO2, Ven: 42.9 mmHg — ABNORMAL LOW (ref 45.0–50.0)
pH, Ven: 7.348 — ABNORMAL HIGH (ref 7.250–7.300)
pO2, Ven: 31 mmHg (ref 31.0–45.0)

## 2015-12-30 LAB — PHOSPHORUS: Phosphorus: 4.5 mg/dL (ref 2.5–4.6)

## 2015-12-30 LAB — GLUCOSE, CAPILLARY
GLUCOSE-CAPILLARY: 123 mg/dL — AB (ref 65–99)
GLUCOSE-CAPILLARY: 99 mg/dL (ref 65–99)
GLUCOSE-CAPILLARY: 176 mg/dL — AB (ref 65–99)
GLUCOSE-CAPILLARY: 32 mg/dL — AB (ref 65–99)
GLUCOSE-CAPILLARY: 37 mg/dL — AB (ref 65–99)
GLUCOSE-CAPILLARY: 139 mg/dL — AB (ref 65–99)
GLUCOSE-CAPILLARY: 93 mg/dL (ref 65–99)
Glucose-Capillary: 45 mg/dL — ABNORMAL LOW (ref 65–99)
Glucose-Capillary: 148 mg/dL — ABNORMAL HIGH (ref 65–99)
Glucose-Capillary: 78 mg/dL (ref 65–99)
Glucose-Capillary: 72 mg/dL (ref 65–99)

## 2015-12-30 LAB — MAGNESIUM
MAGNESIUM: 5.4 mg/dL — AB (ref 1.7–2.4)
Magnesium: 1.4 mg/dL — ABNORMAL LOW (ref 1.7–2.4)

## 2015-12-30 LAB — TROPONIN I

## 2015-12-30 LAB — ECHOCARDIOGRAM COMPLETE
Height: 70 in
WEIGHTICAEL: 1968.27 [oz_av]

## 2015-12-30 LAB — LACTIC ACID, PLASMA: LACTIC ACID, VENOUS: 2.8 mmol/L — AB (ref 0.5–2.0)

## 2015-12-30 MED ORDER — HEPARIN (PORCINE) IN NACL 100-0.45 UNIT/ML-% IJ SOLN
800.0000 [IU]/h | INTRAMUSCULAR | Status: DC
  Administered 2015-12-30: 800 [IU]/h via INTRAVENOUS
  Filled 2015-12-30: qty 250

## 2015-12-30 MED ORDER — DEXTROSE 50 % IV SOLN
1.0000 | Freq: Once | INTRAVENOUS | Status: AC
  Administered 2015-12-30: 50 mL via INTRAVENOUS

## 2015-12-30 MED ORDER — FENTANYL CITRATE (PF) 100 MCG/2ML IJ SOLN: 50.0000 ug | Freq: Once | INTRAMUSCULAR | Status: DC

## 2015-12-30 MED ORDER — POTASSIUM CHLORIDE 10 MEQ/50ML IV SOLN
10.0000 meq | INTRAVENOUS | Status: AC
  Administered 2015-12-30 (×3): 10 meq via INTRAVENOUS
  Filled 2015-12-30 (×4): qty 50

## 2015-12-30 MED ORDER — SODIUM CHLORIDE 0.9 % IV BOLUS (SEPSIS)
1000.0000 mL | Freq: Once | INTRAVENOUS | Status: AC
  Administered 2015-12-30: 1000 mL via INTRAVENOUS

## 2015-12-30 MED ORDER — CHLORHEXIDINE GLUCONATE 0.12% ORAL RINSE (MEDLINE KIT)
15.0000 mL | Freq: Two times a day (BID) | OROMUCOSAL | Status: DC
  Administered 2015-12-30 – 2015-12-31 (×3): 15 mL via OROMUCOSAL

## 2015-12-30 MED ORDER — DEXTROSE 50 % IV SOLN
INTRAVENOUS | Status: AC
  Filled 2015-12-30: qty 50

## 2015-12-30 MED ORDER — SODIUM CHLORIDE 0.9 % IV BOLUS (SEPSIS)
750.0000 mL | Freq: Once | INTRAVENOUS | Status: AC
  Administered 2015-12-30: 750 mL via INTRAVENOUS

## 2015-12-30 MED ORDER — NOREPINEPHRINE BITARTRATE 1 MG/ML IV SOLN
0.0000 ug/min | INTRAVENOUS | Status: DC
  Administered 2015-12-30: 16 ug/min via INTRAVENOUS
  Administered 2015-12-31: 5 ug/min via INTRAVENOUS
  Filled 2015-12-30 (×2): qty 16

## 2015-12-30 MED ORDER — POTASSIUM CHLORIDE 10 MEQ/50ML IV SOLN
10.0000 meq | INTRAVENOUS | Status: AC
  Administered 2015-12-30: 10 meq via INTRAVENOUS

## 2015-12-30 MED ORDER — POTASSIUM CHLORIDE 20 MEQ/15ML (10%) PO SOLN
40.0000 meq | Freq: Once | ORAL | Status: AC
  Administered 2015-12-30: 40 meq via ORAL
  Filled 2015-12-30: qty 30

## 2015-12-30 MED ORDER — SODIUM CHLORIDE 0.9 % IV SOLN
6.0000 g | Freq: Once | INTRAVENOUS | Status: AC
  Administered 2015-12-30: 6 g via INTRAVENOUS
  Filled 2015-12-30: qty 12

## 2015-12-30 MED ORDER — NOREPINEPHRINE BITARTRATE 1 MG/ML IV SOLN
0.0000 ug/min | INTRAVENOUS | Status: DC
  Filled 2015-12-30: qty 4

## 2015-12-30 MED ORDER — PANTOPRAZOLE SODIUM 40 MG IV SOLR
40.0000 mg | Freq: Every day | INTRAVENOUS | Status: DC
  Administered 2015-12-30: 40 mg via INTRAVENOUS
  Filled 2015-12-30: qty 40

## 2015-12-30 MED ORDER — HEPARIN BOLUS VIA INFUSION
2000.0000 [IU] | Freq: Once | INTRAVENOUS | Status: AC
  Administered 2015-12-30: 2000 [IU] via INTRAVENOUS
  Filled 2015-12-30: qty 2000

## 2015-12-30 MED ORDER — FENTANYL BOLUS VIA INFUSION
25.0000 ug | INTRAVENOUS | Status: DC | PRN
  Administered 2015-12-31 (×2): 25 ug via INTRAVENOUS
  Filled 2015-12-30: qty 25

## 2015-12-30 MED ORDER — MIDAZOLAM HCL 2 MG/2ML IJ SOLN
1.0000 mg | INTRAMUSCULAR | Status: DC | PRN
  Filled 2015-12-30: qty 2

## 2015-12-30 MED ORDER — ENOXAPARIN SODIUM 40 MG/0.4ML ~~LOC~~ SOLN
40.0000 mg | SUBCUTANEOUS | Status: DC
  Administered 2015-12-30: 40 mg via SUBCUTANEOUS
  Filled 2015-12-30: qty 0.4

## 2015-12-30 MED ORDER — VASOPRESSIN 20 UNIT/ML IV SOLN
0.0300 [IU]/min | INTRAVENOUS | Status: DC
  Administered 2015-12-31: 0.03 [IU]/min via INTRAVENOUS
  Filled 2015-12-30: qty 2

## 2015-12-30 MED ORDER — HEPARIN SODIUM (PORCINE) 5000 UNIT/ML IJ SOLN: 5000.0000 [IU] | Freq: Three times a day (TID) | INTRAMUSCULAR | Status: DC

## 2015-12-30 MED ORDER — CEFEPIME HCL 1 G IJ SOLR
1.0000 g | Freq: Three times a day (TID) | INTRAMUSCULAR | Status: DC
  Administered 2015-12-30 – 2015-12-31 (×4): 1 g via INTRAVENOUS
  Filled 2015-12-30 (×7): qty 1

## 2015-12-30 MED ORDER — SODIUM CHLORIDE 0.9 % IV SOLN
25.0000 ug/h | INTRAVENOUS | Status: DC
  Administered 2015-12-30: 50 ug/h via INTRAVENOUS
  Administered 2015-12-31: 100 ug/h via INTRAVENOUS
  Filled 2015-12-30 (×2): qty 50

## 2015-12-30 MED ORDER — SODIUM CHLORIDE 0.9 % IV SOLN
INTRAVENOUS | Status: DC
  Administered 2015-12-30 – 2015-12-31 (×3): via INTRAVENOUS

## 2015-12-30 MED ORDER — VITAL HIGH PROTEIN PO LIQD
1000.0000 mL | ORAL | Status: DC
  Administered 2015-12-30: 20 mL

## 2015-12-30 MED ORDER — VANCOMYCIN HCL IN DEXTROSE 750-5 MG/150ML-% IV SOLN
750.0000 mg | Freq: Two times a day (BID) | INTRAVENOUS | Status: DC
  Administered 2015-12-30 – 2015-12-31 (×3): 750 mg via INTRAVENOUS
  Filled 2015-12-30 (×4): qty 150

## 2015-12-30 MED ORDER — MIDAZOLAM HCL 2 MG/2ML IJ SOLN: 1.0000 mg | INTRAMUSCULAR | Status: DC | PRN

## 2015-12-30 MED ORDER — DEXTROSE 10 % IV SOLN
INTRAVENOUS | Status: DC
  Administered 2015-12-30: 16:00:00 via INTRAVENOUS

## 2015-12-30 MED ORDER — VITAL AF 1.2 CAL PO LIQD
1000.0000 mL | ORAL | Status: DC
  Administered 2015-12-30: 1000 mL

## 2015-12-30 MED ORDER — HYDROCORTISONE NA SUCCINATE PF 100 MG IJ SOLR
50.0000 mg | Freq: Four times a day (QID) | INTRAMUSCULAR | Status: DC
  Administered 2015-12-30 – 2015-12-31 (×4): 50 mg via INTRAVENOUS
  Filled 2015-12-30 (×4): qty 2

## 2015-12-30 MED ORDER — SODIUM CHLORIDE 0.9 % IV SOLN: 250.0000 mL | INTRAVENOUS | Status: DC | PRN

## 2015-12-30 MED ORDER — ANTISEPTIC ORAL RINSE SOLUTION (CORINZ)
7.0000 mL | OROMUCOSAL | Status: DC
  Administered 2015-12-30 – 2015-12-31 (×11): 7 mL via OROMUCOSAL

## 2015-12-30 MED FILL — Midazolam HCl Inj 5 MG/5ML (Base Equivalent): INTRAMUSCULAR | Qty: 5 | Status: AC

## 2015-12-30 MED FILL — Norepinephrine Bitartrate IV Soln 1 MG/ML (Base Equivalent): INTRAVENOUS | Qty: 4 | Status: AC

## 2015-12-30 NOTE — Progress Notes (Signed)
eLink Physician-Brief Progress Note Patient Name: Samuel Rhodes DOB: [birth year] MRN: [id number]   Date of Service  12/18/2015  HPI/Events of Note  Tachy Levo has increased to 30 mcg cvp 4  eICU Interventions  1 L bolus Add vaso     Intervention Category Major Interventions: Shock - evaluation and management  ALVA,RAKESH V. 12/13/2015, 11:16 PM

## 2015-12-30 NOTE — Progress Notes (Signed)
CRITICAL VALUE ALERT  Critical value received:  Lactic acid 2.8  Date of notification:  12/14/2015  Time of notification:  0553  Critical value read back:yes  Nurse who received alert:  Katrine Coho  MD notified (1st page):  E-link MD  Time of first page:  [phone]   Notified of potassium 3.1 and mag 1.4 as well.

## 2015-12-30 NOTE — Progress Notes (Signed)
[facility] ADULT ICU REPLACEMENT PROTOCOL FOR AM LAB REPLACEMENT ONLY  The [facility] does [facility] Electrolyte Replacment Protocol based on the criteria listed below:   1. Is GFR >/= 40 ml/min? Yes.    Patient's GFR today is >60 2. Is urine output >/= 0.5 ml/kg/hr for the last 6 hours? Yes.   Patient's UOP is 2.2 ml/kg/hr 3. Is BUN < 60 mg/dL? Yes.    Patient's BUN today is 23 4. Abnormal electrolyte(s): K 3.1, Mg 1.4 5. Ordered repletion with: per protocol 6. If a panic level lab has been reported, has the CCM MD in charge been notified? Yes.  .   Physician:  Malena Edman, [place] 01/03/2016 6:00 AM

## 2015-12-30 NOTE — Progress Notes (Signed)
   01/04/2016 1200  Clinical Encounter Type  Visited With Family  Visit Type Initial;Psychological support;Spiritual support;Social support;Critical Care  Spiritual Encounters  Spiritual Needs Emotional;Grief support  CH visited with pt family at bedside, introducing [place] and functions; [place] offered social, emotional and spiritual support.  [place] available as needed.   Gwynn Burly 12:05 PM

## 2015-12-30 NOTE — H&P (Signed)
PULMONARY / CRITICAL CARE MEDICINE   Name: Samuel Rhodes MRN: [id number] DOB: 02-25-1946    ADMISSION DATE:  12/29/2015 CONSULTATION DATE:  12/26/2015  REFERRING MD:  Transfer from [facility] [place]:  Septic Shock  HISTORY OF PRESENT ILLNESS:  Pt is encephelopathic; therefore, this HPI is obtained from chart review. Samuel Rhodes is a 70 y.o. male with PMH as outlined below including stage IV tonsillar cancer (squamous cell).  He has been seen by Dr. Alvy Bimler in the past and has declined chemo or radiation.  In fact, he had agreed to declare himself DNR and be evaluated by palliative care / hospice (per Dr. Calton Dach office note from March 2017).  He was seen at [facility] on 12/23/15 for dysuria and penile pain and was found to have UTI.  He opted to go home with PO antibiotics versus be admitted.  Following return home, he apparently developed generalized weakness followed by AMS.  Due to persistent symptoms, he presented to [facility] on 12/28/15 and again was found to have  UTI.  In addition, CXR showed bilateral infiltrates concerning for PNA.  Due to hypotension, he was admitted and treated with IVF's and empiric abx.    He had apparently been doing well given his circumstances and wife states that on afternoon of 12/29/15, he had worsening oxygenation and waxing and waning mental status.  He was evaluated at bedside and physician at the time had mentioned that since pt was DNR, options were limited.  His wife states that she was under the impression that his acute decline was primarily due to infection and discussed changing code status temporarily to give pt a chance to fight off infection would be possible.  Code status was subsequently changed and pt was intubated and started on vasopressors.  He also developed new onset AFRVR for which he was started on cardizem.  CXR reportedly was suggestive of ARDS (unable to pull up report at this  time).  He was then transferred to [facility] for further evaluation and management.  On pt's arrival to [facility], extensive discussions were had between Dr. Corrie Dandy and pt's wife and daughter.  Pt's history, current circumstances, and prior discussions with oncology and family were discussed.  Given that pt had clearly stated his wishes including the fact that he would NOT want heroic measures and would never undergo chemo / radiation / etc, decision was made to list pt as DNR again but to otherwise continue with current supportive measures.  Pt's wife is not ready to limit his care to no escalation and states that she would address that when the time came if needed.   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  PAST SURGICAL HISTORY: He  has past surgical history that includes Lipoma excision and tonsil biopsy (02/17/15).  No Known Allergies  No current facility-administered medications on file prior to encounter.   Current Outpatient Prescriptions on File Prior to Encounter  Medication Sig  . cephALEXin (KEFLEX) 500 MG capsule Take 1 capsule (500 mg total) by mouth 4 (four) times daily.  . CO ENZYME Q-10 PO Take 1 capsule by mouth daily.   . codeine 15 MG tablet Take 1 tablet (15 mg total) by mouth every 6 (six) hours as needed for moderate pain.  . diazepam (VALIUM) 5 MG tablet Take 1 tablet (5 mg total) by mouth every  12 (twelve) hours as needed for anxiety.  Marland Kitchen esomeprazole (NEXIUM) 40 MG capsule Take 40 mg by mouth daily at 12 noon.  . Ginkgo Biloba (GINKOBA PO) Take 1 tablet by mouth daily.   Marland Kitchen morphine (ROXANOL) 20 MG/ML concentrated solution Take 0.5 mLs (10 mg total) by mouth every 4 (four) hours as needed for severe pain.  . OIL OF OREGANO PO Take 1 tablet by mouth daily.   . Povidone-Iodine (IODINE SOLUTION EX) Take by mouth.  . Probiotic Product (PROBIOTIC DAILY PO) Take 1 tablet by mouth daily.    . sodium fluoride (FLUORISHIELD) 1.1 % GEL dental gel Instill one drop of gel per tooth space of fluoride tray. Place over lower teeth for 5 minutes. Remove. Spit out excess. Repeat nightly. (Patient not taking: Reported on 04/07/2015)  . tamsulosin (FLOMAX) 0.4 MG CAPS capsule Take 1 capsule (0.4 mg total) by mouth daily.  Marland Kitchen UNABLE TO FIND Take 2.5 mLs by mouth daily. CHAPARRAL LEAF    FAMILY HISTORY:  His indicated that his mother is deceased. He indicated that his father is deceased. He indicated that his brother is alive. He indicated that his paternal grandfather is deceased.   SOCIAL HISTORY: He  reports that he quit smoking about 13 years ago. He has never used smokeless tobacco. He reports that he uses illicit drugs (Marijuana). He reports that he does not drink alcohol.  REVIEW OF SYSTEMS:   Unable to obtain as pt is encephalopathic.  SUBJECTIVE:  On vent, unresponsive.  VITAL SIGNS: BP 93/63 mmHg  Pulse 92  Resp 20  SpO2 93%  HEMODYNAMICS:    VENTILATOR SETTINGS: Vent Mode:  [-] PRVC FiO2 (%):  [40 %-80 %] 80 % Set Rate:  [20 bmp-22 bmp] 22 bmp Vt Set:  [500 mL-550 mL] 550 mL PEEP:  [10 cmH20] 10 cmH20  INTAKE / OUTPUT:     PHYSICAL EXAMINATION: General: Middle aged male, cachectic, in NAD. Neuro:  Sedated, not responsive. HEENT: Temporal wasting bilaterally.  PERRL, sclerae anicteric. Cardiovascular: IRIR, no M/R/G.  Lungs: Respirations even and unlabored.  Very faint bibasilar rhonchi. Crackles at bases.  Abdomen: BS x 4, soft, NT/ND.  Musculoskeletal: Diffuse muscle wasting, no edema.  Skin: Intact, warm, no rashes.  LABS:  BMET  Recent Labs Lab 12/23/15 2340  NA 133*  K 4.4  CL 94*  CO2 31  BUN 23*  CREATININE 0.69  GLUCOSE 119*    Electrolytes  Recent Labs Lab 12/23/15 2340  CALCIUM 8.6*    CBC  Recent Labs Lab 12/23/15 2340  WBC 25.3*  HGB 11.3*  HCT 33.7*  PLT 263    Coag's  Recent Labs Lab 12/23/15 2340  INR  1.18    Sepsis Markers  Recent Labs Lab 12/24/15 0710  LATICACIDVEN 0.87    ABG  Recent Labs Lab 12/10/2015 0358  PHART 7.261*  PCO2ART 49.3*  PO2ART 59.0*    Liver Enzymes  Recent Labs Lab 12/23/15 2340  AST 33  ALT 30  ALKPHOS 86  BILITOT 0.6  ALBUMIN 2.5*    Cardiac Enzymes No results for input(s): TROPONINI, PROBNP in the last 168 hours.  Glucose No results for input(s): GLUCAP in the last 168 hours.  Imaging No results found.   STUDIES:  CXR 05/23 > Echo 05/23 >  CULTURES: Blood 05/23 >  Urine 05/23 > Sputum 05/23 >  ANTIBIOTICS: Vanc 05/23 >  Zosyn 05/23 >  SIGNIFICANT EVENTS: 05/23 > admit  LINES/TUBES: ETT 05/23 > R Christian  CVL 05/23 > R fem a line 05/23 >  DISCUSSION: 70 y.o. M with hx stage IV tonsillar cancer (squamous cell), transferred from [facility] with septic shock due to urosepsis as well as concern for ARDS.  Was previously DNR; however, code status changed at outside hospital after acute decline.  Now listed as DNR again after extensive discussions with pt's wife and daughter.  ASSESSMENT / PLAN:  INFECTIOUS A:   Septic Shock - due to urosepsis and possible HCAP. P:   Abx as above (vanc / zosyn).  Follow cultures as above. PCT algorithm to limit abx exposure.  CARDIOVASCULAR A:  Septic Shock - due to urosepsis and possible HCAP. New onset AFRVR - started on cardizem at outside hospital.  D/c'd here due to hypotension. P:  Continue levophed as needed for goal MAP > 65. Assess CVP's, troponin, echo. Hold off on diltiazem drip (HR now in 90s and with low BP) If develops RVR again then would try amio.  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 VTE Prophylaxis. P:  Previously seen by Dr. Alvy Bimler of oncology and pt had opted to NOT undergo chemo or radiation; pt had actually been referred to palliative care. Palliative care consult. SCD's / heparin  gtt. CBC in AM.  PULMONARY A: Acute hypoxemic hypercapneic respiratory failure 2/2 Lung mets/concern for HCAP. 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 P:   Full vent support. Vent settings adjusted based off initial ABG. VAP prevention measures. SBT once more stable only. Empiric abx / cultures per ID section. CXR in AM.  RENAL A:   Hypokalemia - K 2.7 at outside hospital; unsure if repleted or not. P:   STAT CMP. Correct electrolytes as indicated. NS @ 75. BMP in AM.  GASTROINTESTINAL A:   GI prophylaxis. Nutrition. Significant protein calorie malnutrition. P:   SUP: Pantoprazole. NPO. Nutrition consult for TF's.  ENDOCRINE A:   Hypoglycemia at outside hospital.  P:   Assess CBG's, may require D5.  NEUROLOGIC A:   Acute encephalopathy. Generalized deconditioning with failure to thrive. P:   Sedation:  Fentanyl gtt / Midazolam PRN. RASS goal: 0 to -1. Daily WUA. Palliative care consult.  Family updated: Wife and daughter at bedside (daughter is Therapist, sports at [employer]).  Extensive discussions were had between Dr. Corrie Dandy and family.  Pt's history, current circumstances, and prior discussions with oncology and family were discussed.  Given that pt had clearly stated his wishes including the fact that he would NOT want heroic measures and would never undergo chemo / radiation / etc, decision was made to list pt as DNR again but to otherwise continue with current supportive measures.  Pt's wife is not ready to limit his care to no escalation and states that she would address that when the time came if needed.  Interdisciplinary Family Meeting v Palliative Care Meeting:  Due by: 05/29.  CC time: 40 minutes.   Montey Hora, [facility] Pager: [phone]  or [phone] 12/26/2015, 4:17 AM   ATTENDING NOTE / ATTESTATION NOTE :   I have discussed the case with the resident/APP  Montey Hora  I agree with the  resident/APP's  history, physical examination, assessment, and plans.  I have edited the above note and modified it according to our agreed history, physical examination, assessment and plan.  Briefly, 32M, with st IV tonsillar CA with mets to the lungs dxed in May 2016, who opted no Chemo or Radiation, and opted for quality of life per wife, admitted for acute decline felt to be related to sepsis from possible UTI. HCAP. Pt was initially DNR but wife changed code status and agreed pt to be intubated and placed on pressors since she felt pt could get better form the acute decline (sepsis, HCAP, UTI). Pt resp status apparently worsened with volume resuscitation requiting intubation. Went into rapid Afib. Started pressors after being diuresced initially. BP 90 systolic on levophed drip, afib in 90-100, RR 20. 92% on 50-60%FiO2. Afebrile. Cachectic, chronically ill. Bibasilar crackles. (-) edema).  Plan for panculture. Cont broad spectrum Abx.  Vent has been adjusted. Cont IVF.  NPO for now > start TF later on today.  No evidence of fungal infxn by history. Re-address code status if pt declines next 12-24 hrs. Suggest Palliative Care consult.   I have spent 35 minutes of critical care time with this patient today.  Family :I extensively d/w wife and daughter the over all prognosis and pt's condition. Pt remains a DNR -- no CPR, ACLS, no surgery, no HD.I asked about escalation of care > she is uncertain at this pont.  Plan to see how the pt does the next 12-24 hrs. Wife and daughter will readdress code status (i.e. potentially no escalation of care) if pt clinically deteriorates.    Monica Becton, MD 12/28/2015, 4:51 AM [facility] Pager [phone] After 3 pm or if no answer, call [phone]

## 2015-12-30 NOTE — Progress Notes (Signed)
Hypoglycemic Event  CBG: 45   Treatment: D50 IV 50 mL  Symptoms: None  Follow-up CBG: Time:0755 CBG Result:176  Possible Reasons for Event: Inadequate meal intake  Comments/MD notified:    Samuel Rhodes

## 2015-12-30 NOTE — Progress Notes (Signed)
No charge note.  Spoke with wife Rana Snare at bedside.  She politely refused consultation at this point.  "I've been up for 3 days and I don't want to deal with this right now".  As the patient is intubated and moving towards comfort - I probed slightly further - but she was very clear that she did not want to speak with Palliative at this point.  Please re-consult PMT if we can be of service.  Thanks, Imogene Burn, [place] [facility] Pager: [phone]

## 2015-12-30 NOTE — Progress Notes (Signed)
Hypoglycemic Event  CBG: 32  Treatment: D50 IV 50 mL  Symptoms: None  Follow-up CBG: Time:1218 CBG Result:148  Possible Reasons for Event: Inadequate meal intake  Comments/MD notified:MD aware     Samuel Rhodes K

## 2015-12-30 NOTE — Progress Notes (Signed)
Initial Nutrition Assessment  DOCUMENTATION CODES:   Severe malnutrition in context of chronic illness, Underweight  INTERVENTION:   Initiate Vital AF 1.2 @ 20 ml/hr via NG tube and increase by 10 ml every 12 hours to goal rate of 50 ml/hr.   Tube feeding regimen provides 1440 kcal (97% of needs), 90 grams of protein, and 973 ml of H2O.   Recommend monitor magnesium, potassium, and phosphorus daily for at least 3 days, MD to replete as needed, as pt is at risk for refeeding syndrome given severe malnutrition.   NUTRITION DIAGNOSIS:   Malnutrition related to chronic illness (stage IV cancer) as evidenced by severe depletion of body fat, severe depletion of muscle mass, 38 percent weight loss x 1 year.  GOAL:   Patient will meet greater than or equal to 90% of their needs  MONITOR:   Vent status, Labs, TF tolerance, Weight trends, I & O's, Skin  REASON FOR ASSESSMENT:   Consult Enteral/tube feeding initiation and management  ASSESSMENT:   Pt with hx of stage IV tonsillar cancer (squamous cell) which was dx 7/16. Pt refused chemo/XRT. Pt admitted with septic shock due to urosepsis.    Patient is currently intubated on ventilator support MV: 12.7 L/min Temp (24hrs), Avg:97.5 F (36.4 C), Min:97.4 F (36.3 C), Max:97.8 F (36.6 C)  Medications reviewed and include: solucortef, magnesium sulfate, KCl,  Labs reviewed: potassium low 3.1, magnesium low 1.4, phosphorus WNL CBG's: 45-176 NG tube Weight on admission was 102 lb, pt weight up to 123 lb with severe edema.  Discussed with RN.  Nutrition-Focused physical exam completed. Findings are severe fat depletion, severe muscle depletion, and severe edema.   Spoke with wife who was at bedside. She reports that pt opted out of cancer treatment due to risks involved. Prior to about a week ago pt was very active. They travel, he gardens, etc. He loves to cook and had a great appetite. In addition pt was also taking protein  drinks that his wife made him. She makes her own almond milk, uses pea protein, yogurt,etc to make him milkshakes. Due to tumor growth at the jaw pt was no longer able to open his mouth very big and had lost his taste for protein foods. Any protein foods were soft and meats had to be cut very small since he could not open his mouth very wide. For the last week pt has not been eating nothing due to pain related to his UTI and constipation. Wife reports edema x 2 months PTA.   Diet Order:  Diet NPO time specified  Skin:  Wound (see comment) (deep tissue injury on sacrum)  Last BM:  unknown  Height:   Ht Readings from Last 1 Encounters:  12/18/2015 5\' 10"  (1.778 m)    Weight:   Wt Readings from Last 1 Encounters:  12/16/2015 123 lb 0.3 oz (55.8 kg)    Ideal Body Weight:  75.4 kg  BMI:  Body mass index is 17.65 kg/(m^2).  Estimated Nutritional Needs:   Kcal:  1488  Protein:  80-95 grams  Fluid:  > 1.5 L/day  EDUCATION NEEDS:   No education needs identified at this time  [place], [place], [place] Pager [phone] After Hours Pager

## 2015-12-30 NOTE — Progress Notes (Signed)
*  PRELIMINARY RESULTS* Echocardiogram 2D Echocardiogram has been performed.  Leavy Cella 01/06/2016, 9:18 AM

## 2015-12-30 NOTE — Progress Notes (Signed)
Hypoglycemic Event  CBG: 37  Treatment: D50 IV 50 mL  Symptoms: None  Follow-up CBG: Time:1549 CBG Result:139  Possible Reasons for Event: Inadequate meal intake  Comments/MD notified:Feinstein orders to give 1 amp start d10 @ 40. Will continue to monitor closely.     Zykeem Bauserman K

## 2015-12-30 NOTE — Progress Notes (Signed)
ANTICOAGULATION/ANTIBIOTIC CONSULT NOTE - Initial Consult  Pharmacy Consult for heparin and vancomycin/cefepime Indication: atrial fibrillation and sepsis/HCAP  No Known Allergies  Patient Measurements: Height: 5\' 10"  (177.8 cm) Weight: 123 lb 0.3 oz (55.8 kg) IBW/kg (Calculated) : 73  Vital Signs: BP: 93/63 mmHg (05/23 0330) Pulse Rate: 92 (05/23 0350)  Estimated Creatinine Clearance: 57.9 mL/min (by C-G formula based on Cr of 0.69).   Medical History: Past Medical History  Diagnosis Date  . Tonsil cancer ([place]) 02/17/15    squamous cell  . Other specified anxiety disorders 04/15/2015    Medications:  Prescriptions prior to admission  Medication Sig Dispense Refill Last Dose  . cephALEXin (KEFLEX) 500 MG capsule Take 1 capsule (500 mg total) by mouth 4 (four) times daily. 40 capsule 0   . CO ENZYME Q-10 PO Take 1 capsule by mouth daily.    Taking  . codeine 15 MG tablet Take 1 tablet (15 mg total) by mouth every 6 (six) hours as needed for moderate pain. 60 tablet 0 12/23/2015 at 2000  . diazepam (VALIUM) 5 MG tablet Take 1 tablet (5 mg total) by mouth every 12 (twelve) hours as needed for anxiety. 30 tablet 0   . esomeprazole (NEXIUM) 40 MG capsule Take 40 mg by mouth daily at 12 noon.   Taking  . Ginkgo Biloba (GINKOBA PO) Take 1 tablet by mouth daily.    Taking  . morphine (ROXANOL) 20 MG/ML concentrated solution Take 0.5 mLs (10 mg total) by mouth every 4 (four) hours as needed for severe pain. 240 mL 0 Taking  . OIL OF OREGANO PO Take 1 tablet by mouth daily.    Taking  . Povidone-Iodine (IODINE SOLUTION EX) Take by mouth.   Taking  . Probiotic Product (PROBIOTIC DAILY PO) Take 1 tablet by mouth daily.    Taking  . sodium fluoride (FLUORISHIELD) 1.1 % GEL dental gel Instill one drop of gel per tooth space of fluoride tray. Place over lower teeth for 5 minutes. Remove. Spit out excess. Repeat nightly. (Patient not taking: Reported on 04/07/2015) 120 mL 11 Not Taking  .  tamsulosin (FLOMAX) 0.4 MG CAPS capsule Take 1 capsule (0.4 mg total) by mouth daily. 30 capsule 0   . UNABLE TO FIND Take 2.5 mLs by mouth daily. CHAPARRAL LEAF   Taking   Scheduled:  . pantoprazole (PROTONIX) IV  40 mg Intravenous QHS   Infusions:  . sodium chloride    . norepinephrine (LEVOPHED) Adult infusion      Assessment: 70yo male admitted to hospital in [place] on 5/21 for urosepsis, now requiring intubation, pressors, and elevated level of care, tx'd to [facility] ICU, to continue ABX; has been rec'ing vanc 750mg  Q12H (last dose apparently 5/22 1315) and Zosyn 3.375g Q6H (last dose apparently 5/22 1730), to change to vanc and cefepime.  Has been rec'ing LMWH DVT Px (last dose 5/22 0600), now to begin heparin for Afib.  Labs from OSH: WBC 3.7 (has metastatic ca), Hgb 11.8, Plt 99, SCr 0.45 (cachectic), K 2.7, alb 1.4, INR 1.4  Goal of Therapy:  Heparin level 0.3-0.7 units/ml  Vanc trough 15-20 Monitor platelets by anticoagulation protocol: Yes   Plan:  Will continue vancomycin 750mg  IV Q12H and change Zosyn to cefepime 1g IV Q8H and monitor CBC, Cx, levels prn.  Will give heparin 2000 units IV bolus x1 followed by gtt at 800 units/hr and monitor heparin levels and CBC.  Wynona Neat, PharmD, BCPS  01/07/2016,4:29 AM

## 2015-12-30 NOTE — H&P (Signed)
PULMONARY / CRITICAL CARE MEDICINE   Name: Samuel Rhodes MRN: [id number] DOB: [birth year]    ADMISSION DATE:  12/28/2015 CONSULTATION DATE:  12/20/2015  REFERRING MD:  Transfer from [facility] [place]:  Septic Shock  HISTORY OF PRESENT ILLNESS:  Pt is encephelopathic; therefore, this HPI is obtained from chart review. Samuel Rhodes is a 70 y.o. male with PMH as outlined below including stage IV tonsillar cancer (squamous cell).  He has been seen by Dr. Alvy Bimler in the past and has declined chemo or radiation.  In fact, he had agreed to declare himself DNR and be evaluated by palliative care / hospice (per Dr. Calton Dach office note from March 2017).  He was seen at [facility] on 12/23/15 for dysuria and penile pain and was found to have UTI.  He opted to go home with PO antibiotics versus be admitted.  Following return home, he apparently developed generalized weakness followed by AMS.  Admitted for PNA septic shock. DNR established  SUBJECTIVE:  aggitation with WUA Levophed 4   VITAL SIGNS: BP 95/64 mmHg  Pulse 107  Temp(Src) 97.4 F (36.3 C) (Axillary)  Resp 26  Ht 5\' 10"  (1.778 m)  Wt 55.8 kg (123 lb 0.3 oz)  BMI 17.65 kg/m2  SpO2 93%  HEMODYNAMICS: CVP:  [4 mmHg-7 mmHg] 4 mmHg  VENTILATOR SETTINGS: Vent Mode:  [-] PRVC FiO2 (%):  [40 %-80 %] 80 % Set Rate:  [20 bmp-22 bmp] 22 bmp Vt Set:  [500 mL-550 mL] 550 mL PEEP:  [10 cmH20] 10 cmH20 Plateau Pressure:  [15 cmH20] 15 cmH20  INTAKE / OUTPUT: I/O last 3 completed shifts: In: 463.7 [I.V.:213.7; IV Piggyback:250] Out: 900 [Urine:900]   PHYSICAL EXAMINATION: General: Middle aged male, cachectic, in NAD. Neuro:  Sedated, rass -3 HEENT: Temporal wasting bilaterally.  PERRL, sclerae anicteric. Cardiovascular: IRIR, no M/R/G.  Lungs: crackles fine diffuse Abdomen: BS x 4, soft, NT/ND.  Musculoskeletal: Diffuse muscle wasting, no edema.  Skin: Intact, warm, no  rashes.  LABS:  BMET  Recent Labs Lab 12/23/15 2340 12/22/2015 0445  NA 133* 138  K 4.4 3.1*  CL 94* 106  CO2 31 25  BUN 23* 23*  CREATININE 0.69 0.59*  GLUCOSE 119* 97    Electrolytes  Recent Labs Lab 12/23/15 2340 12/15/2015 0445  CALCIUM 8.6* 7.4*  MG  --  1.4*  PHOS  --  4.5    CBC  Recent Labs Lab 12/23/15 2340 12/23/2015 0445  WBC 25.3* 4.6  HGB 11.3* 11.6*  HCT 33.7* 35.8*  PLT 263 91*    Coag's  Recent Labs Lab 12/23/15 2340  INR 1.18    Sepsis Markers  Recent Labs Lab 12/24/15 0710 12/21/2015 0445  LATICACIDVEN 0.87 2.8*  PROCALCITON  --  12.94    ABG  Recent Labs Lab 01/04/2016 0358  PHART 7.261*  PCO2ART 49.3*  PO2ART 59.0*    Liver Enzymes  Recent Labs Lab 12/23/15 2340 12/22/2015 0445  AST 33 36  ALT 30 26  ALKPHOS 86 99  BILITOT 0.6 0.5  ALBUMIN 2.5* 1.2*    Cardiac Enzymes  Recent Labs Lab 12/21/2015 0445  TROPONINI <0.03    Glucose  Recent Labs Lab 12/12/2015 0337 12/21/2015 0735 01/05/2016 0754  GLUCAP 99 45* 176*    Imaging Dg Chest Port 1 View  12/30/2015  CLINICAL DATA:  Endotracheal tube placement.  Initial encounter. EXAM: PORTABLE CHEST 1 VIEW COMPARISON:  PET/CT performed 03/27/2015 FINDINGS: The patient's endotracheal tube is seen  ending 5 cm above the carina. An enteric tube is noted extending below the diaphragm. Small bilateral pleural effusions are seen. Diffuse bilateral airspace opacification raises concern for pulmonary edema or pneumonia. Given the patient's history, underlying malignancy could have a similar appearance. No pneumothorax is seen. The cardiomediastinal silhouette is normal in size. No acute osseous abnormalities are identified. IMPRESSION: 1. Endotracheal tube seen ending 5 cm above the carina. 2. Small bilateral pleural effusions seen. Diffuse bilateral airspace opacification raises concern for pulmonary edema or pneumonia. Given the patient's history, underlying malignancy could have a  similar appearance. Electronically Signed   By: Garald Balding M.D.   On: 12/24/2015 05:12     STUDIES:  CXR 05/23 > Echo 05/23 >  CULTURES: Blood 05/23 >  Urine 05/23 > Sputum 05/23 >  ANTIBIOTICS: Vanc 05/23 >  cefepime 05/23 >  SIGNIFICANT EVENTS: 05/23 > admit  LINES/TUBES: ETT 05/23 > R [place] CVL 05/23 > R fem a line 05/23 >5/23  DISCUSSION: 70 y.o. M with hx stage IV tonsillar cancer (squamous cell), transferred from [facility] with septic shock due to urosepsis as well as concern for ARDS.  Was previously DNR; however, code status changed at outside hospital after acute decline.  Now listed as DNR again after extensive discussions with pt's wife and daughter.  ASSESSMENT / PLAN:  INFECTIOUS A:   Septic Shock - due to urosepsis and possible HCAP. P:   Abx as above (vanc / cefepime Pct noted, unlikely to change management with PCT will receive course Follow sputum pcxr in am   CARDIOVASCULAR A:  Septic Shock - due to urosepsis and possible HCAP. New onset AFRVR - started on cardizem at outside hospital.  D/c'd here due to hypotension. 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 VTE Prophylaxis. P:  Previously seen by Dr. Alvy Bimler of oncology and pt had opted to NOT undergo chemo or radiation; pt had actually been referred to palliative care. SCD's / heparin gtt, have concerns that anticoagulation risk / benefit ratio does to favor heparin  CBC in AM  PULMONARY A: Acute hypoxemic hypercapneic respiratory failure 2/2 Lung mets/concern for HCAP. 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 ALI P:   likely mostly mets more than HCAP, but cannot r/o abg reviewed, repeat now, may need peep to 12, rate increase? Would think we  should move to comfort care Keep plat less 30   RENAL A:   Hypokalemia hypovolemia P:   Bolus Increase fluid rate k supp BMP in AM.  GASTROINTESTINAL A:   GI prophylaxis. Nutrition. Significant protein calorie malnutrition. P:   SUP: Pantoprazole. NPO. Nutrition consult, stat TF  ENDOCRINE A:   Hypoglycemia at outside hospital.  likley AI (low glu, low bp) P:   No insulin unless glu greater 200 x 2 Empiric roids  NEUROLOGIC A:   Acute encephalopathy. Generalized deconditioning with failure to thrive. P:   Sedation:  Fentanyl gtt / Midazolam PRN. RASS goal: 0 to -1. Daily WUA.  I will update wife and fam and consider comfort now  Interdisciplinary Family Meeting v Palliative Care Meeting:  Due by: 05/29.  CC time: 50 minutes.  Lavon Paganini. Titus Mould, MD, [place] Pgr: [place] [facility]

## 2015-12-31 ENCOUNTER — Inpatient Hospital Stay (HOSPITAL_COMMUNITY): Payer: Medicare Other

## 2015-12-31 DIAGNOSIS — J9601 Acute respiratory failure with hypoxia: Secondary | ICD-10-CM

## 2015-12-31 DIAGNOSIS — G9340 Encephalopathy, unspecified: Secondary | ICD-10-CM

## 2015-12-31 LAB — CBC WITH DIFFERENTIAL/PLATELET
BASOS ABS: 0 10*3/uL (ref 0.0–0.1)
Basophils Relative: 0 %
EOS PCT: 0 %
Eosinophils Absolute: 0 10*3/uL (ref 0.0–0.7)
HEMATOCRIT: 30.7 % — AB (ref 39.0–52.0)
HEMOGLOBIN: 9.8 g/dL — AB (ref 13.0–17.0)
LYMPHS PCT: 3 %
Lymphs Abs: 0.2 10*3/uL — ABNORMAL LOW (ref 0.7–4.0)
MCH: 30.5 pg (ref 26.0–34.0)
MCHC: 31.9 g/dL (ref 30.0–36.0)
MCV: 95.6 fL (ref 78.0–100.0)
MONOS PCT: 0 %
Monocytes Absolute: 0 10*3/uL — ABNORMAL LOW (ref 0.1–1.0)
NEUTROS ABS: 5.3 10*3/uL (ref 1.7–7.7)
Neutrophils Relative %: 97 %
Platelets: 30 10*3/uL — ABNORMAL LOW (ref 150–400)
RBC: 3.21 MIL/uL — AB (ref 4.22–5.81)
RDW: 16.1 % — ABNORMAL HIGH (ref 11.5–15.5)
WBC: 5.5 10*3/uL (ref 4.0–10.5)

## 2015-12-31 LAB — URINE CULTURE: Culture: NO GROWTH

## 2015-12-31 LAB — GLUCOSE, CAPILLARY
GLUCOSE-CAPILLARY: 26 mg/dL — AB (ref 65–99)
GLUCOSE-CAPILLARY: 95 mg/dL (ref 65–99)
GLUCOSE-CAPILLARY: 66 mg/dL (ref 65–99)
Glucose-Capillary: 139 mg/dL — ABNORMAL HIGH (ref 65–99)

## 2015-12-31 LAB — POCT I-STAT 3, ART BLOOD GAS (G3+)
Acid-base deficit: 11 mmol/L — ABNORMAL HIGH (ref 0.0–2.0)
Bicarbonate: 18.4 mEq/L — ABNORMAL LOW (ref 20.0–24.0)
O2 Saturation: 92 %
PCO2 ART: 51.4 mmHg — AB (ref 35.0–45.0)
PH ART: 7.16 — AB (ref 7.350–7.450)
TCO2: 20 mmol/L (ref 0–100)
pO2, Arterial: 79 mmHg — ABNORMAL LOW (ref 80.0–100.0)

## 2015-12-31 LAB — COMPREHENSIVE METABOLIC PANEL
ALK PHOS: 77 U/L (ref 38–126)
ALT: 23 U/L (ref 17–63)
AST: 35 U/L (ref 15–41)
Anion gap: 8 (ref 5–15)
BILIRUBIN TOTAL: 0.6 mg/dL (ref 0.3–1.2)
BUN: 33 mg/dL — AB (ref 6–20)
CALCIUM: 6.6 mg/dL — AB (ref 8.9–10.3)
CO2: 18 mmol/L — ABNORMAL LOW (ref 22–32)
CREATININE: 0.95 mg/dL (ref 0.61–1.24)
Chloride: 111 mmol/L (ref 101–111)
GFR calc Af Amer: >60 mL/min (ref >60)
GLUCOSE: 106 mg/dL — AB (ref 65–99)
POTASSIUM: 3.8 mmol/L (ref 3.5–5.1)
SODIUM: 137 mmol/L (ref 135–145)
TOTAL PROTEIN: 3 g/dL — AB (ref 6.5–8.1)

## 2015-12-31 LAB — PROCALCITONIN: PROCALCITONIN: 19.5 ng/mL

## 2015-12-31 LAB — PHOSPHORUS: Phosphorus: 4.5 mg/dL (ref 2.5–4.6)

## 2015-12-31 LAB — MAGNESIUM: Magnesium: 2.4 mg/dL (ref 1.7–2.4)

## 2015-12-31 MED ORDER — SODIUM CHLORIDE 0.9% FLUSH: 10.0000 mL | Freq: Two times a day (BID) | INTRAVENOUS | Status: DC

## 2015-12-31 MED ORDER — SODIUM CHLORIDE 0.9 % IV BOLUS (SEPSIS)
500.0000 mL | Freq: Once | INTRAVENOUS | Status: AC
Start: 2015-12-31 — End: 2015-12-31
  Administered 2015-12-31: 500 mL via INTRAVENOUS

## 2015-12-31 MED ORDER — SODIUM CHLORIDE 0.9% FLUSH: 10.0000 mL | INTRAVENOUS | Status: DC | PRN

## 2015-12-31 MED ORDER — DEXTROSE 50 % IV SOLN
1.0000 | Freq: Once | INTRAVENOUS | Status: AC
  Administered 2015-12-31: 50 mL via INTRAVENOUS

## 2015-12-31 MED ORDER — LORAZEPAM BOLUS VIA INFUSION
2.0000 mg | INTRAVENOUS | Status: DC | PRN
  Administered 2015-12-31 (×2): 2 mg via INTRAVENOUS
  Filled 2015-12-31: qty 5

## 2015-12-31 MED ORDER — DEXTROSE 50 % IV SOLN
INTRAVENOUS | Status: AC
  Filled 2015-12-31: qty 50

## 2015-12-31 MED ORDER — MORPHINE SULFATE 25 MG/ML IV SOLN
1.0000 mg/h | INTRAVENOUS | Status: DC
  Administered 2015-12-31: 4 mg/h via INTRAVENOUS
  Filled 2015-12-31: qty 10

## 2015-12-31 MED ORDER — MORPHINE BOLUS VIA INFUSION
5.0000 mg | INTRAVENOUS | Status: DC | PRN
  Administered 2015-12-31 (×2): 5 mg via INTRAVENOUS
  Filled 2015-12-31 (×3): qty 20

## 2015-12-31 MED ORDER — DEXTROSE 50 % IV SOLN: 1.0000 | Freq: Once | INTRAVENOUS | Status: AC

## 2015-12-31 MED ORDER — DEXTROSE 50 % IV SOLN
INTRAVENOUS | Status: AC
  Administered 2015-12-31: 50 mL
  Filled 2015-12-31: qty 50

## 2015-12-31 MED ORDER — LORAZEPAM 2 MG/ML IJ SOLN
1.0000 mg/h | INTRAVENOUS | Status: DC
  Administered 2015-12-31: 2 mg/h via INTRAVENOUS
  Filled 2015-12-31: qty 25

## 2015-12-31 MED ORDER — SODIUM ACETATE 2 MEQ/ML IV SOLN
INTRAVENOUS | Status: DC
  Administered 2015-12-31: 04:00:00 via INTRAVENOUS
  Filled 2015-12-31 (×2): qty 1000

## 2016-01-01 ENCOUNTER — Telehealth: Payer: Self-pay

## 2016-01-01 LAB — CULTURE, RESPIRATORY W GRAM STAIN: Culture: NORMAL

## 2016-01-01 LAB — CULTURE, RESPIRATORY

## 2016-01-01 NOTE — Telephone Encounter (Signed)
On 01/01/2016 I received a death certificate from [place] (original). The death certificate is for cremation. The patient is a patient of Doctor Titus Mould. The death certificate will be taken to [facility]) for signature. On 01-23-2016 I received the death certificate back from Doctor Titus Mould. I got the death certificate ready and called the funeral home to let them know the death certificate is ready for pickup. I also faxed a copy per their request.

## 2016-01-04 LAB — CULTURE, BLOOD (ROUTINE X 2)
CULTURE: NO GROWTH
Culture: NO GROWTH

## 2016-01-08 NOTE — Progress Notes (Signed)
Hypoglycemic Event  CBG: 66  Treatment: D50 IV 50 mL  Symptoms: None  Follow-up CBG: Time:815 CBG Result:138  Possible Reasons for Event: Unknown  Comments/MD notified:Feinstein    Domingo Sep

## 2016-01-08 NOTE — Progress Notes (Signed)
Two rings removed, given to s/o at bedside this AM.

## 2016-01-08 NOTE — Progress Notes (Signed)
[facility] Progress Note Patient Name: Samuel Rhodes DOB: [birth year] MRN: [id number]   Date of Service  2016/01/19  HPI/Events of Note  cvp remains low ABG-resp + metab acidosis  eICU Interventions  500 bolus Increase RR 30 Consider acetate gtt     Intervention Category Major Interventions: Hypovolemia - evaluation and treatment with fluids;Acid-Base disturbance - evaluation and management  Chalee Hirota V. 2016-01-19, 3:27 AM

## 2016-01-08 NOTE — Progress Notes (Signed)
Hypoglycemic Event  CBG: 26  Treatment: D50  Symptoms: diaphoretic   Follow-up CBG: Time:0350 CBG Result:95  Possible Reasons for Event: Unknown  Comments/MD notified:CCMD aware     Samuel Rhodes

## 2016-01-08 NOTE — H&P (Addendum)
PULMONARY / CRITICAL CARE MEDICINE   Name: Samuel Rhodes MRN: [id number] DOB: Oct 07, 1945    ADMISSION DATE:  01/02/2016 CONSULTATION DATE:  12/16/2015  REFERRING MD:  Transfer from [facility] [place]:  Septic Shock  HISTORY OF PRESENT ILLNESS:  Pt is encephelopathic; therefore, this HPI is obtained from chart review. Samuel Rhodes is a 70 y.o. male with PMH as outlined below including stage IV tonsillar cancer (squamous cell).  He has been seen by Dr. Alvy Bimler in the past and has declined chemo or radiation.  In fact, he had agreed to declare himself DNR and be evaluated by palliative care / hospice (per Dr. Calton Dach office note from March 2017).  He was seen at [facility] on 12/23/15 for dysuria and penile pain and was found to have UTI.  He opted to go home with PO antibiotics versus be admitted.  Following return home, he apparently developed generalized weakness followed by AMS.  Admitted for PNA septic shock. DNR established  SUBJECTIVE:  Hypoglycemia Stress steroids started More volume given, levophed was elevated Fib again rvr Vasopressin, levophed improved  VITAL SIGNS: BP 103/52 mmHg  Pulse 99  Temp(Src) 97.7 F (36.5 C) (Axillary)  Resp 30  Ht 5\' 10"  (1.778 m)  Wt 64.4 kg (141 lb 15.6 oz)  BMI 20.37 kg/m2  SpO2 98%  HEMODYNAMICS: CVP:  [4 mmHg-9 mmHg] 9 mmHg  VENTILATOR SETTINGS: Vent Mode:  [-] PRVC FiO2 (%):  [70 %-80 %] 70 % Set Rate:  [22 bmp-30 bmp] 30 bmp Vt Set:  [550 mL] 550 mL PEEP:  [15 cmH20] 15 cmH20 Plateau Pressure:  [30 cmH20-34 cmH20] 32 cmH20  INTAKE / OUTPUT: I/O last 3 completed shifts: In: 6955.3 [I.V.:4195.3; NG/GT:460; IV Piggyback:2300] Out: 1505 [Urine:1505]   PHYSICAL EXAMINATION: General: Middle aged male, cachectic, in NAD. Neuro:  Sedated, rass -2, FC HEENT: Temporal wasting bilaterally.  PERRL, sclerae anicteric. Cardiovascular: IRT mild, s1 s2  Lungs: coarse Abdomen: BS x 4, soft, NT/ND.   Musculoskeletal: Diffuse muscle wasting, increasing edema.  Skin: Intact, warm, no rashes.  LABS:  BMET  Recent Labs Lab 12/08/2015 0445 2016/01/21 0357  NA 138 137  K 3.1* 3.8  CL 106 111  CO2 25 18*  BUN 23* 33*  CREATININE 0.59* 0.95  GLUCOSE 97 106*    Electrolytes  Recent Labs Lab 12/21/2015 0445 12/08/2015 1935 01-21-2016 0357  CALCIUM 7.4*  --  6.6*  MG 1.4* 5.4* 2.4  PHOS 4.5  --  4.5    CBC  Recent Labs Lab 01/03/2016 0445 2016-01-21 0357  WBC 4.6 5.5  HGB 11.6* 9.8*  HCT 35.8* 30.7*  PLT 91* 30*    Coag's No results for input(s): APTT, INR in the last 168 hours.  Sepsis Markers  Recent Labs Lab 12/28/2015 0445 01/21/16 0357  LATICACIDVEN 2.8*  --   PROCALCITON 12.94 19.50    ABG  Recent Labs Lab 12/13/2015 1107 12/19/2015 1240 01/21/16 0319  PHART 7.347* 7.319* 7.160*  PCO2ART 39.0 43.3 51.4*  PO2ART 59.0* 111* 79.0*    Liver Enzymes  Recent Labs Lab 01/05/2016 0445 01/21/2016 0357  AST 36 35  ALT 26 23  ALKPHOS 99 77  BILITOT 0.5 0.6  ALBUMIN 1.2* <1.0*    Cardiac Enzymes  Recent Labs Lab 12/21/2015 0445  TROPONINI <0.03    Glucose  Recent Labs Lab 12/11/2015 1936 01/03/2016 2311 21-Jan-2016 0318 01/21/2016 0355 2016/01/21 0752 01/21/16 0822  GLUCAP 72 93 26* 95 66 139*  Imaging Dg Chest Port 1 View  13-Jan-2016  CLINICAL DATA:  Lung mass.  Intubation. EXAM: PORTABLE CHEST 1 VIEW COMPARISON:  12/11/2015.  PET-CT 03/27/2015. FINDINGS: Endotracheal tube, NG tube, right subclavian line in stable position. Heart size normal. Diffuse bilateral airspace disease again noted. No interim change. Underlying metastatic disease may be present. Small bilateral pleural effusions. No pneumothorax. No acute bony abnormality identified. IMPRESSION: 1. Lines and tubes in stable position. 2. Dense diffuse bilateral airspace disease again noted. Underlying metastatic disease cannot be excluded. Stable small bilateral pleural effusions. Electronically  Signed   By: Marcello Moores  Register   On: 2016/01/13 07:02    STUDIES:  CXR 05/23 > Echo 05/23 >60-65%, mild rv down, small per eff  CULTURES: Blood 05/23 >  Urine 05/23 > Sputum 05/23 >  ANTIBIOTICS: Vanc 05/23 >  cefepime 05/23 >  SIGNIFICANT EVENTS: 05/23 > admit, cvp low, 6 liters pos, levophed, vaso, rvr  LINES/TUBES: ETT 05/23 > R [place] CVL 05/23 > R fem a line 05/23 >5/23  DISCUSSION: 70 y.o. M with hx stage IV tonsillar cancer (squamous cell), transferred from [facility] with septic shock due to urosepsis as well as concern for ARDS.  Was previously DNR; however, code status changed at outside hospital after acute decline.  Now listed as DNR again after extensive discussions with pt's wife and daughter.  ASSESSMENT / PLAN:  INFECTIOUS A:   Septic Shock - due to urosepsis and possible HCAP. P:   Maintain current ABX I have reviewed CT done at outside  CARDIOVASCULAR A:  Septic Shock - due to urosepsis and possible HCAP. New onset AFRVR - started on cardizem at outside hospital.  D/c'd here due to hypotension. 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 VTE Prophylaxis. Thrombocytopenia (sepsis, dilution) P:  Unable to use loevenox, some bloody secretions If increased blood, tx plat CBC in AM  PULMONARY A: Acute hypoxemic hypercapneic respiratory failure 2/2 Lung mets/concern for HCAP. 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 High peep needs emphysema ALI P:   Plat less 30 as goal Some edema on top today on pcxr? Last abg reviewed, keep rate high Bicarb for ards ph less 7.25, unlikley to change outcome To goal 60% if able then peep to 12 pcxr in am  RENAL A:   Hypokalemia Hypovolemia  resolved P:   kvo Chem in am   GASTROINTESTINAL A:   GI prophylaxis. Nutrition. Significant protein calorie malnutrition. P:   SUP: Pantoprazole. NPO. TF to goal  ENDOCRINE A:   Hypoglycemia likley AI (low glu, low bp) P:   d10 to 65 cc/hr, may need d50 drip Empiric roids keep  NEUROLOGIC A:   Acute encephalopathy. Generalized deconditioning with failure to thrive. P:   Sedation:  Fentanyl gtt / Midazolam PRN. RASS goal: -2 to -3 with peep No WUA able  I will update wife and fam and consider comfort now, today also  Interdisciplinary Family Meeting v Palliative Care Meeting:  Due by: 05/29.  CC time: 35 minutes.  Samuel Rhodes. Titus Mould, MD, FACP Pgr: [place] [facility]    Extensive discussion with family wife and daughter. We discussed the poor prognosis and likely poor quality of life.  Family has decided to offer full comfort care. They are aware that the patient may be transferred to palliative care floor for continued comfort care needs. They have been fully updated on the process and expectations.  Samuel Rhodes. Titus Mould, MD, [place] Pgr: [place] [facility]

## 2016-01-08 NOTE — Progress Notes (Signed)
   01-24-16 1100  Clinical Encounter Type  Visited With Patient  Visit Type Patient actively dying  Spiritual Encounters  Spiritual Needs Emotional;Grief support  Ch spoke with spouse and son at bedside to offer support; Blair available later this afternoon to support family at/post terminal extubation.  Page [facility] at [phone] when needed.  11:45 AM Gwynn Burly

## 2016-01-08 NOTE — Procedures (Signed)
Extubation Procedure Note  Patient Details:   Name: Samuel Rhodes DOB: 09-03-1945 MRN: [id number]   Airway Documentation:     Evaluation  O2 sats: pt extubated per withdrawal of life support protocol. Complications: No apparent complications Patient did tolerate procedure well. Bilateral Breath Sounds: Rhonchi   No, pt was extubated per withdrawal of life support protocol.  RN and Family at bedside, pt appeared comfortable t/o process.   Lenna Sciara 01-24-2016, 1:12 PM

## 2016-01-08 NOTE — Progress Notes (Signed)
eLink Physician-Brief Progress Note Patient Name: Yeab Codd DOB: 05/07/1946 MRN: [id number]   Date of Service  2016/01/25  HPI/Events of Note  Bloody oral secretions  eICU Interventions  Dc lovenox     Intervention Category Intermediate Interventions: Medication change / dose adjustment  Mickey Esguerra V. 01-25-16, 6:30 AM

## 2016-01-08 NOTE — Discharge Summary (Signed)
PULMONARY / CRITICAL CARE MEDICINE   Name: Samuel Rhodes MRN: [id number] DOB: November 01, 1945    ADMISSION DATE:  12/15/2015 CONSULTATION DATE:  12/24/2015  REFERRING MD:  Transfer from [facility] of [place]:  Septic Shock  HISTORY OF PRESENT ILLNESS:  Pt is encephelopathic; therefore, this HPI is obtained from chart review. Samuel Rhodes is a 70 y.o. male with PMH as outlined below including stage IV tonsillar cancer (squamous cell).  He has been seen by Dr. Alvy Bimler in the past and has declined chemo or radiation.  In fact, he had agreed to declare himself DNR and be evaluated by palliative care / hospice (per Dr. Calton Dach office note from March 2017).  He was seen at [facility] on 12/23/15 for dysuria and penile pain and was found to have UTI.  He opted to go home with PO antibiotics versus be admitted.  Following return home, he apparently developed generalized weakness followed by AMS.  Due to persistent symptoms, he presented to [facility] on 12/28/15 and again was found to have  UTI.  In addition, CXR showed bilateral infiltrates concerning for PNA.  Due to hypotension, he was admitted and treated with IVF's and empiric abx.    He had apparently been doing well given his circumstances and wife states that on afternoon of 12/29/15, he had worsening oxygenation and waxing and waning mental status.  He was evaluated at bedside and physician at the time had mentioned that since pt was DNR, options were limited.  His wife states that she was under the impression that his acute decline was primarily due to infection and discussed changing code status temporarily to give pt a chance to fight off infection would be possible.  Code status was subsequently changed and pt was intubated and started on vasopressors.  He also developed new onset AFRVR for which he was started on cardizem.  CXR reportedly was suggestive of ARDS (unable to pull up report at this  time).  He was then transferred to [facility] for further evaluation and management.  On pt's arrival to [facility], extensive discussions were had between Dr. Corrie Dandy and pt's wife and daughter.  Pt's history, current circumstances, and prior discussions with oncology and family were discussed.  Given that pt had clearly stated his wishes including the fact that he would NOT want heroic measures and would never undergo chemo / radiation / etc, decision was made to list pt as DNR again but to otherwise continue with current supportive measures.  Pt's wife is not ready to limit his care to no escalation and states that she would address that when the time came if needed.   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  PAST SURGICAL HISTORY: He  has past surgical history that includes Lipoma excision and tonsil biopsy (02/17/15).  No Known Allergies  No current facility-administered medications on file prior to encounter.   Current Outpatient Prescriptions on File Prior to Encounter  Medication Sig  . codeine 15 MG tablet Take 1 tablet (15 mg total) by mouth every 6 (six) hours as needed for moderate pain.  Marland Kitchen morphine (ROXANOL) 20 MG/ML concentrated solution Take 0.5 mLs (10 mg total) by mouth every 4 (four) hours as needed for severe pain.  . cephALEXin (KEFLEX) 500 MG capsule Take 1 capsule (500 mg total) by mouth 4 (four) times daily. (Patient not taking: Reported  on 12/21/2015)  . diazepam (VALIUM) 5 MG tablet Take 1 tablet (5 mg total) by mouth every 12 (twelve) hours as needed for anxiety. (Patient not taking: Reported on 12/11/2015)  . sodium fluoride (FLUORISHIELD) 1.1 % GEL dental gel Instill one drop of gel per tooth space of fluoride tray. Place over lower teeth for 5 minutes. Remove. Spit out excess. Repeat nightly. (Patient not taking: Reported on 12/23/2015)  . tamsulosin (FLOMAX) 0.4 MG CAPS capsule Take 1  capsule (0.4 mg total) by mouth daily. (Patient not taking: Reported on 01/05/2016)    FAMILY HISTORY:  His indicated that his mother is deceased. He indicated that his father is deceased. He indicated that his brother is alive. He indicated that his paternal grandfather is deceased.   SOCIAL HISTORY: He  reports that he quit smoking about 13 years ago. He has never used smokeless tobacco. He reports that he uses illicit drugs (Marijuana). He reports that he does not drink alcohol.  REVIEW OF SYSTEMS:   Unable to obtain as pt is encephalopathic.  SUBJECTIVE:  On vent, unresponsive.  VITAL SIGNS: BP 103/52 mmHg  Pulse 111  Temp(Src) 97.7 F (36.5 C) (Axillary)  Resp 0  Ht 5\' 10"  (1.778 m)  Wt 64.4 kg (141 lb 15.6 oz)  BMI 20.37 kg/m2  SpO2 83%  HEMODYNAMICS:    VENTILATOR SETTINGS:    INTAKE / OUTPUT: I/O last 3 completed shifts: In: 882.2 [I.V.:829.2; NG/GT:53] Out: 100 [Urine:100]   PHYSICAL EXAMINATION: General: Middle aged male, cachectic, in NAD. Neuro:  Sedated, not responsive. HEENT: Temporal wasting bilaterally.  PERRL, sclerae anicteric. Cardiovascular: IRIR, no M/R/G.  Lungs: Respirations even and unlabored.  Very faint bibasilar rhonchi. Crackles at bases.  Abdomen: BS x 4, soft, NT/ND.  Musculoskeletal: Diffuse muscle wasting, no edema.  Skin: Intact, warm, no rashes.  LABS:  BMET  Recent Labs Lab 12/19/2015 0445 2016/01/11 0357  NA 138 137  K 3.1* 3.8  CL 106 111  CO2 25 18*  BUN 23* 33*  CREATININE 0.59* 0.95  GLUCOSE 97 106*    Electrolytes  Recent Labs Lab 12/16/2015 0445 12/10/2015 1935 2016/01/11 0357  CALCIUM 7.4*  --  6.6*  MG 1.4* 5.4* 2.4  PHOS 4.5  --  4.5    CBC  Recent Labs Lab 12/11/2015 0445 01/11/2016 0357  WBC 4.6 5.5  HGB 11.6* 9.8*  HCT 35.8* 30.7*  PLT 91* 30*    Coag's No results for input(s): APTT, INR in the last 168 hours.  Sepsis Markers  Recent Labs Lab 12/22/2015 0445 January 11, 2016 0357  LATICACIDVEN  2.8*  --   PROCALCITON 12.94 19.50    ABG  Recent Labs Lab 01/04/2016 1107 01/06/2016 1240 January 11, 2016 0319  PHART 7.347* 7.319* 7.160*  PCO2ART 39.0 43.3 51.4*  PO2ART 59.0* 111* 79.0*    Liver Enzymes  Recent Labs Lab 12/13/2015 0445 01/11/2016 0357  AST 36 35  ALT 26 23  ALKPHOS 99 77  BILITOT 0.5 0.6  ALBUMIN 1.2* <1.0*    Cardiac Enzymes  Recent Labs Lab 12/29/2015 0445  TROPONINI <0.03    Glucose  Recent Labs Lab 12/10/2015 1936 12/22/2015 2311 2016-01-11 0318 11-Jan-2016 0355 01-11-16 0752 01/11/2016 0822  GLUCAP 72 93 26* 95 66 139*    Imaging No results found.   STUDIES:  CXR 05/23 > Echo 05/23 >  CULTURES: Blood 05/23 >  Urine 05/23 > Sputum 05/23 >  ANTIBIOTICS: Vanc 05/23 >  Zosyn 05/23 >  SIGNIFICANT EVENTS: 05/23 > admit  LINES/TUBES: ETT 05/23 > R [place] CVL 05/23 > R fem a line 05/23 >  DISCUSSION: 70 y.o. M with hx stage IV tonsillar cancer (squamous cell), transferred from [facility] with septic shock due to urosepsis as well as concern for ARDS.  Was previously DNR; however, code status changed at outside hospital after acute decline.  Now listed as DNR again after extensive discussions with pt's wife and daughter.  ASSESSMENT / PLAN:  INFECTIOUS A:   Septic Shock - due to urosepsis and possible HCAP. P:   Abx as above (vanc / zosyn).  Follow cultures as above. PCT algorithm to limit abx exposure.  CARDIOVASCULAR A:  Septic Shock - due to urosepsis and possible HCAP. New onset AFRVR - started on cardizem at outside hospital.  D/c'd here due to hypotension. P:  Continue levophed as needed for goal MAP > 65. Assess CVP's, troponin, echo. Hold off on diltiazem drip (HR now in 90s and with low BP) If develops RVR again then would try amio.  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 VTE Prophylaxis. P:  Previously seen by Dr. Alvy Bimler of oncology and pt had opted  to NOT undergo chemo or radiation; pt had actually been referred to palliative care. Palliative care consult. SCD's / heparin gtt. CBC in AM.  PULMONARY A: Acute hypoxemic hypercapneic respiratory failure 2/2 Lung mets/concern for HCAP. 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 P:   Full vent support. Vent settings adjusted based off initial ABG. VAP prevention measures. SBT once more stable only. Empiric abx / cultures per ID section. CXR in AM.  RENAL A:   Hypokalemia - K 2.7 at outside hospital; unsure if repleted or not. P:   STAT CMP. Correct electrolytes as indicated. NS @ 75. BMP in AM.  GASTROINTESTINAL A:   GI prophylaxis. Nutrition. Significant protein calorie malnutrition. P:   SUP: Pantoprazole. NPO. Nutrition consult for TF's.  ENDOCRINE A:   Hypoglycemia at outside hospital.  P:   Assess CBG's, may require D5.  NEUROLOGIC A:   Acute encephalopathy. Generalized deconditioning with failure to thrive. P:   Sedation:  Fentanyl gtt / Midazolam PRN. RASS goal: 0 to -1. Daily WUA. Palliative care consult.  Family updated: Wife and daughter at bedside (daughter is Therapist, sports at [employer]).  Extensive discussions were had between Dr. Corrie Dandy and family.  Pt's history, current circumstances, and prior discussions with oncology and family were discussed.  Given that pt had clearly stated his wishes including the fact that he would NOT want heroic measures and would never undergo chemo / radiation / etc, decision was made to list pt as DNR again but to otherwise continue with current supportive measures.  Pt's wife is not ready to limit his care to no escalation and states that she would address that when the time came if needed.  Interdisciplinary Family Meeting v Palliative Care Meeting:  Due by: 05/29.  CC time: 40 minutes.   Montey Hora, [facility] Pager: [phone]  or [phone] 01/01/2016, 11:52  PM   ATTENDING NOTE / ATTESTATION NOTE :   I have discussed the case with the resident/APP  Montey Hora  I agree with the resident/APP's  history, physical examination, assessment, and plans.  I have edited the above note and modified it according to our agreed history, physical  examination, assessment and plan.    Briefly, 35M, with st IV tonsillar CA with mets to the lungs dxed in May 2016, who opted no Chemo or Radiation, and opted for quality of life per wife, admitted for acute decline felt to be related to sepsis from possible UTI. HCAP. Pt was initially DNR but wife changed code status and agreed pt to be intubated and placed on pressors since she felt pt could get better form the acute decline (sepsis, HCAP, UTI). Pt resp status apparently worsened with volume resuscitation requiting intubation. Went into rapid Afib. Started pressors after being diuresced initially. BP 90 systolic on levophed drip, afib in 90-100, RR 20. 92% on 50-60%FiO2. Afebrile. Cachectic, chronically ill. Bibasilar crackles. (-) edema).  Plan for panculture. Cont broad spectrum Abx.  Vent has been adjusted. Cont IVF.  NPO for now > start TF later on today.  No evidence of fungal infxn by history. Re-address code status if pt declines next 12-24 hrs. Suggest Palliative Care consult.   I have spent 35 minutes of critical care time with this patient today.  Family :I extensively d/w wife and daughter the over all prognosis and pt's condition. Pt remains a DNR -- no CPR, ACLS, no surgery, no HD.I asked about escalation of care > she is uncertain at this pont.  Plan to see how the pt does the next 12-24 hrs. Wife and daughter will readdress code status (i.e. potentially no escalation of care) if pt clinically deteriorates.    Monica Becton, MD 01/01/2016, 11:52 PM [place] [facility] Pager [phone] After 3 pm or if no answer, call [phone]   Pt remained critically ill. Pls see last PN by Dr  Titus Mould below:  PULMONARY / [place]   Name: Samuel Rhodes MRN: [id number] DOB: 1946/02/03    ADMISSION DATE:  12/12/2015 CONSULTATION DATE:  12/24/2015  REFERRING MD:  Transfer from [facility] of [place]:  Septic Shock  HISTORY OF PRESENT ILLNESS:  Pt is encephelopathic; therefore, this HPI is obtained from chart review. Bastian Balsbaugh is a 70 y.o. male with PMH as outlined below including stage IV tonsillar cancer (squamous cell).  He has been seen by Dr. Alvy Bimler in the past and has declined chemo or radiation.  In fact, he had agreed to declare himself DNR and be evaluated by palliative care / hospice (per Dr. Calton Dach office note from March 2017).  He was seen at [facility] on 12/23/15 for dysuria and penile pain and was found to have UTI.  He opted to go home with PO antibiotics versus be admitted.  Following return home, he apparently developed generalized weakness followed by AMS.  Admitted for PNA septic shock. DNR established  SUBJECTIVE:  Hypoglycemia Stress steroids started More volume given, levophed was elevated Fib again rvr Vasopressin, levophed improved  VITAL SIGNS: BP 103/52 mmHg  Pulse 111  Temp(Src) 97.7 F (36.5 C) (Axillary)  Resp 0  Ht 5\' 10"  (1.778 m)  Wt 64.4 kg (141 lb 15.6 oz)  BMI 20.37 kg/m2  SpO2 83%  HEMODYNAMICS:    VENTILATOR SETTINGS:    INTAKE / OUTPUT: I/O last 3 completed shifts: In: 882.2 [I.V.:829.2; NG/GT:53] Out: 100 [Urine:100]   PHYSICAL EXAMINATION: General: Middle aged male, cachectic, in NAD. Neuro:  Sedated, rass -2, FC HEENT: Temporal wasting bilaterally.  PERRL, sclerae anicteric. Cardiovascular: IRT mild, s1 s2  Lungs: coarse Abdomen: BS x 4, soft, NT/ND.  Musculoskeletal: Diffuse muscle  wasting, increasing edema.  Skin: Intact, warm, no rashes.  LABS:  BMET  Recent Labs Lab 12/26/2015 0445 01/18/2016 0357  NA 138 137  K 3.1* 3.8  CL 106 111  CO2 25 18*  BUN  23* 33*  CREATININE 0.59* 0.95  GLUCOSE 97 106*    Electrolytes  Recent Labs Lab 12/24/2015 0445 12/28/2015 1935 01/18/2016 0357  CALCIUM 7.4*  --  6.6*  MG 1.4* 5.4* 2.4  PHOS 4.5  --  4.5    CBC  Recent Labs Lab 01/05/2016 0445 Jan 18, 2016 0357  WBC 4.6 5.5  HGB 11.6* 9.8*  HCT 35.8* 30.7*  PLT 91* 30*    Coag's No results for input(s): APTT, INR in the last 168 hours.  Sepsis Markers  Recent Labs Lab 01/03/2016 0445 2016/01/18 0357  LATICACIDVEN 2.8*  --   PROCALCITON 12.94 19.50    ABG  Recent Labs Lab 12/20/2015 1107 12/24/2015 1240 2016-01-18 0319  PHART 7.347* 7.319* 7.160*  PCO2ART 39.0 43.3 51.4*  PO2ART 59.0* 111* 79.0*    Liver Enzymes  Recent Labs Lab 12/08/2015 0445 2016-01-18 0357  AST 36 35  ALT 26 23  ALKPHOS 99 77  BILITOT 0.5 0.6  ALBUMIN 1.2* <1.0*    Cardiac Enzymes  Recent Labs Lab 01/06/2016 0445  TROPONINI <0.03    Glucose  Recent Labs Lab 12/30/15 1936 12/30/15 2311 2016/01/18 0318 01/18/16 0355 01/18/2016 0752 01/18/16 0822  GLUCAP 72 93 26* 95 66 139*    Imaging No results found.  STUDIES:  CXR 05/23 > Echo 05/23 >60-65%, mild rv down, small per eff  CULTURES: Blood 05/23 >  Urine 05/23 > Sputum 05/23 >  ANTIBIOTICS: Vanc 05/23 >  cefepime 05/23 >  SIGNIFICANT EVENTS: 05/23 > admit, cvp low, 6 liters pos, levophed, vaso, rvr  LINES/TUBES: ETT 05/23 > R [place] CVL 05/23 > R fem a line 05/23 >5/23  DISCUSSION: 70 y.o. M with hx stage IV tonsillar cancer (squamous cell), transferred from [facility] with septic shock due to urosepsis as well as concern for ARDS.  Was previously DNR; however, code status changed at outside hospital after acute decline.  Now listed as DNR again after extensive discussions with pt's wife and daughter.  ASSESSMENT / PLAN:  INFECTIOUS A:   Septic Shock - due to urosepsis and possible HCAP. P:   Maintain current ABX I have reviewed CT done at  outside  CARDIOVASCULAR A:  Septic Shock - due to urosepsis and possible HCAP. New onset AFRVR - started on cardizem at outside hospital.  D/c'd here due to hypotension. 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 VTE Prophylaxis. Thrombocytopenia (sepsis, dilution) P:  Unable to use loevenox, some bloody secretions If increased blood, tx plat CBC in AM  PULMONARY A: Acute hypoxemic hypercapneic respiratory failure 2/2 Lung mets/concern for HCAP. 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 High peep needs emphysema ALI P:   Plat less 30 as goal Some edema on top today on pcxr? Last abg reviewed, keep rate high Bicarb for ards ph less 7.25, unlikley to change outcome To goal 60% if able then peep to 12 pcxr in am  RENAL A:   Hypokalemia Hypovolemia resolved P:   kvo  Chem in am   GASTROINTESTINAL A:   GI prophylaxis. Nutrition. Significant protein calorie malnutrition. P:   SUP: Pantoprazole. NPO. TF to goal  ENDOCRINE A:   Hypoglycemia likley AI (low glu, low bp) P:   d10 to 65 cc/hr, may need d50 drip Empiric roids keep  NEUROLOGIC A:   Acute encephalopathy. Generalized deconditioning with failure to thrive. P:   Sedation:  Fentanyl gtt / Midazolam PRN. RASS goal: -2 to -3 with peep No WUA able  I will update wife and fam and consider comfort now, today also  Interdisciplinary Family Meeting v Palliative Care Meeting:  Due by: 05/29.  CC time: 35 minutes.  Lavon Paganini. Titus Mould, MD, FACP Pgr: [place] [facility]    Extensive discussion with family wife and daughter. We discussed the poor prognosis and likely poor quality of life. Family has decided to offer full comfort care. They are  aware that the patient may be transferred to palliative care floor for continued comfort care needs. They have been fully updated on the process and expectations.  Lavon Paganini. Titus Mould, MD, FACP Pgr: [place] [facility]  Pt was made comfort care. Pt was pronounced dead on 15-Jan-2016 at 1315. Family was at bedside.   Monica Becton, MD 01/01/2016, 11:55 PM [place] [facility] Pager [phone] After 3 pm or if no answer, call [phone]

## 2016-01-08 NOTE — Progress Notes (Signed)
40 mL of 50 ML bag of ativan AND 230 mL of 250 mL bag of morphine AND 210 mL of 250 mL bag of fentanyl wasted in sink by two RNs, Lexi Brewing technologist and Drema Dallas.

## 2016-01-08 NOTE — Progress Notes (Signed)
TOD 1315, pronounced by 2 RN. Auscultated heart and lungs for two minutes, absent. Family at bedside. All belongings sent home with spouse. CDS called. [place] notified.

## 2016-01-08 DEATH — deceased
